# Patient Record
Sex: Male | Born: 1960 | Race: White | Hispanic: No | Marital: Married | Smoking: Former smoker
Health system: Southern US, Community
[De-identification: ages and names within clinical notes are randomized; demographics above are authoritative.]

## PROBLEM LIST (undated history)

## (undated) DIAGNOSIS — I4891 Unspecified atrial fibrillation: Secondary | ICD-10-CM

## (undated) DIAGNOSIS — E785 Hyperlipidemia, unspecified: Secondary | ICD-10-CM

## (undated) DIAGNOSIS — R079 Chest pain, unspecified: Secondary | ICD-10-CM

## (undated) DIAGNOSIS — I214 Non-ST elevation (NSTEMI) myocardial infarction: Secondary | ICD-10-CM

## (undated) HISTORY — DX: Chest pain, unspecified: R07.9

## (undated) HISTORY — DX: Unspecified atrial fibrillation: I48.91

## (undated) HISTORY — DX: Non-ST elevation (NSTEMI) myocardial infarction: I21.4

## (undated) HISTORY — DX: Hyperlipidemia, unspecified: E78.5

---

## 2012-02-27 ENCOUNTER — Inpatient Hospital Stay (HOSPITAL_COMMUNITY)
Admission: EM | Admit: 2012-02-27 | Discharge: 2012-03-06 | DRG: 234 | Disposition: A | Discharge status: Home / Self Care | Payer: MEDICAID | Attending: Cardiothoracic Surgery | Admitting: Cardiothoracic Surgery

## 2012-02-27 DIAGNOSIS — D62 Acute posthemorrhagic anemia: Secondary | ICD-10-CM | POA: Diagnosis not present

## 2012-02-27 DIAGNOSIS — F411 Generalized anxiety disorder: Secondary | ICD-10-CM | POA: Diagnosis present

## 2012-02-27 DIAGNOSIS — Z8249 Family history of ischemic heart disease and other diseases of the circulatory system: Secondary | ICD-10-CM

## 2012-02-27 DIAGNOSIS — R079 Chest pain, unspecified: Secondary | ICD-10-CM

## 2012-02-27 DIAGNOSIS — E785 Hyperlipidemia, unspecified: Secondary | ICD-10-CM

## 2012-02-27 DIAGNOSIS — I214 Non-ST elevation (NSTEMI) myocardial infarction: Principal | ICD-10-CM

## 2012-02-27 DIAGNOSIS — I252 Old myocardial infarction: Secondary | ICD-10-CM

## 2012-02-27 DIAGNOSIS — D72829 Elevated white blood cell count, unspecified: Secondary | ICD-10-CM | POA: Diagnosis present

## 2012-02-27 DIAGNOSIS — E876 Hypokalemia: Secondary | ICD-10-CM | POA: Diagnosis not present

## 2012-02-27 DIAGNOSIS — Z87891 Personal history of nicotine dependence: Secondary | ICD-10-CM

## 2012-02-27 DIAGNOSIS — Z79899 Other long term (current) drug therapy: Secondary | ICD-10-CM

## 2012-02-27 DIAGNOSIS — I4891 Unspecified atrial fibrillation: Secondary | ICD-10-CM

## 2012-02-27 DIAGNOSIS — Z7982 Long term (current) use of aspirin: Secondary | ICD-10-CM

## 2012-02-27 DIAGNOSIS — I2510 Atherosclerotic heart disease of native coronary artery without angina pectoris: Secondary | ICD-10-CM

## 2012-02-27 LAB — CBC WITH DIFFERENTIAL/PLATELET
Basophils Relative: 1 % (ref 0–1)
HCT: 40.3 % (ref 39.0–52.0)
Hemoglobin: 14.2 g/dL (ref 13.0–17.0)
Lymphs Abs: 2.2 10*3/uL (ref 0.7–4.0)
MCHC: 35.2 g/dL (ref 30.0–36.0)
Monocytes Absolute: 0.6 10*3/uL (ref 0.1–1.0)
Monocytes Relative: 6 % (ref 3–12)
Neutro Abs: 6 10*3/uL (ref 1.7–7.7)
RBC: 4.44 MIL/uL (ref 4.22–5.81)

## 2012-02-27 LAB — BASIC METABOLIC PANEL
BUN: 15 mg/dL (ref 6–23)
CO2: 26 mEq/L (ref 19–32)
Chloride: 104 mEq/L (ref 96–112)
Creatinine, Ser: 0.83 mg/dL (ref 0.50–1.35)
GFR calc Af Amer: >90 mL/min (ref >90)
Glucose, Bld: 120 mg/dL — ABNORMAL HIGH (ref 70–99)
Potassium: 3.4 mEq/L — ABNORMAL LOW (ref 3.5–5.1)

## 2012-02-27 MED ORDER — HEPARIN (PORCINE) IN NACL 100-0.45 UNIT/ML-% IJ SOLN
1350.0000 [IU]/h | INTRAMUSCULAR | Status: DC
  Administered 2012-02-27: 1000 [IU]/h via INTRAVENOUS
  Administered 2012-02-28: 1350 [IU]/h via INTRAVENOUS
  Filled 2012-02-27 (×2): qty 250

## 2012-02-27 MED ORDER — POTASSIUM CHLORIDE CRYS ER 20 MEQ PO TBCR
40.0000 meq | EXTENDED_RELEASE_TABLET | Freq: Once | ORAL | Status: AC
  Administered 2012-02-28: 40 meq via ORAL
  Filled 2012-02-27: qty 2

## 2012-02-27 MED ORDER — HEPARIN BOLUS VIA INFUSION
4000.0000 [IU] | Freq: Once | INTRAVENOUS | Status: AC
  Administered 2012-02-27: 4000 [IU] via INTRAVENOUS

## 2012-02-27 MED ORDER — SODIUM CHLORIDE 0.9 % IV SOLN
INTRAVENOUS | Status: DC
  Administered 2012-02-27: 23:00:00 via INTRAVENOUS

## 2012-02-27 MED ORDER — NITROGLYCERIN 2 % TD OINT
1.0000 [in_us] | TOPICAL_OINTMENT | Freq: Four times a day (QID) | TRANSDERMAL | Status: DC
  Administered 2012-02-27: 1 [in_us] via TOPICAL
  Filled 2012-02-27: qty 1

## 2012-02-27 NOTE — ED Notes (Signed)
Pt  Reverted to normal sinus rhythm.

## 2012-02-27 NOTE — ED Provider Notes (Addendum)
History     CSN: [id number]  Arrival date & time 02/27/12  2120   First MD Initiated Contact with Patient 02/27/12 2233      Chief Complaint  Patient presents with  . Atrial Fibrillation    (Consider location/radiation/quality/duration/timing/severity/associated sxs/prior treatment) Patient is a 51 y.o. male presenting with atrial fibrillation. The history is provided by the patient.  Atrial Fibrillation Associated symptoms include chest pain. Pertinent negatives include no shortness of breath.   51 year old, male, with a history of hypercholesterolemia, and family history of coronary artery disease, presents emergency department complaining of left sided chest pain.  That he describes as a firmness associated with mild nausea, and soaking, diaphoresis.  He denied shortness of breath.  His symptoms began while he was watching television.  He states that he has had similar symptoms twice in the past.  Once occurred when he was riding his bicycle and another time when he was mowing the lawn when he stopped exercising.  The symptoms resolved.  Today.  His symptoms resolved after getting 2 nitroglycerin, which were given to him by the EMS crew.  He also took aspirin prior to arrival to the emergency department.  Presently, he is asymptomatic.  He denies cough, fevers, chills, leg pain or swelling.  He is in excellent condition and rides his bicycle almost daily.  He does not smoke cigarettes.  Besides having high cholesterol.  He has no other past medical history.  No past medical history on file.  No past surgical history on file.  No family history on file.  History  Substance Use Topics  . Smoking status: Not on file  . Smokeless tobacco: Not on file  . Alcohol Use: Not on file      Review of Systems  Constitutional: Positive for diaphoresis.  HENT: Negative for neck pain.   Respiratory: Negative for cough and shortness of breath.   Cardiovascular: Positive for chest pain.  Negative for palpitations and leg swelling.       Chest pain, which he describes as a hardness, which radiated to his left arm  Gastrointestinal: Positive for nausea. Negative for vomiting.  All other systems reviewed and are negative.    Allergies  Review of patient's allergies indicates no known allergies.  Home Medications   Current Outpatient Rx  Name Route Sig Dispense Refill  . ASPIRIN EC 81 MG PO TBEC Oral Take 81 mg by mouth daily.    . ATORVASTATIN CALCIUM 80 MG PO TABS Oral Take 80 mg by mouth daily.    . DESOXIMETASONE 0.05 % EX CREA Topical Apply 1 application topically 2 (two) times daily.      BP 127/95  Pulse 70  Temp 98.4 F (36.9 C) (Oral)  Resp 20  SpO2 97%  Physical Exam  Nursing note and vitals reviewed. Constitutional: He is oriented to person, place, and time. He appears well-developed and well-nourished. No distress.  HENT:  Head: Normocephalic and atraumatic.  Eyes: Conjunctivae are normal.  Neck: Normal range of motion. Neck supple.  Cardiovascular: Normal rate.   No murmur heard. Pulmonary/Chest: Effort normal and breath sounds normal. No respiratory distress.  Abdominal: Soft. He exhibits no distension.  Musculoskeletal: Normal range of motion.  Neurological: He is alert and oriented to person, place, and time.  Skin: Skin is warm and dry.  Psychiatric: He has a normal mood and affect. Thought content normal.    ED Course  Procedures (including critical care time) chest pain, with radiation into the  left arm, associated with nausea, and diaphoresis.  In a male, who is in excellent physical condition, but has a history of elevated cholesterol, positive family history of coronary artery disease.  His symptoms are resolved.  Now.  We will perform laboratory testing, EKG, and chest x-ray, for evaluation.  Labs Reviewed  CBC WITH DIFFERENTIAL - Abnormal; Notable for the following:    Eosinophils Relative 7 (*)     All other components within  normal limits  BASIC METABOLIC PANEL - Abnormal; Notable for the following:    Potassium 3.4 (*)     Glucose, Bld 120 (*)     All other components within normal limits  POCT I-STAT TROPONIN I - Abnormal; Notable for the following:    Troponin i, poc 0.43 (*)     All other components within normal limits   No results found.   No diagnosis found.  ECG. Atrial fibrillation at 131 beats per minute. Normal axis. Normal intervals. Impression atrial fibrillation, with rapid ventricular response  MDM  Non-STEMI pain-free in the ER.  Nitro paste, and IV heparin started  Discussed with the cardiologist.  He will come see pt.    Discussed with dr. Thedore Mins.  He asked me to consult cards before he would accept the pt.        Cheri Guppy, MD 02/28/12 0018  Cheri Guppy, MD 02/28/12 0020  Cheri Guppy, MD 03/29/12 [phone]

## 2012-02-27 NOTE — ED Notes (Signed)
Pt brought to ED by EMS with chest pain and A fib.Pt took aspirin X 8 and Nitro spray X2.

## 2012-02-28 ENCOUNTER — Encounter (HOSPITAL_COMMUNITY): Payer: Self-pay | Admitting: *Deleted

## 2012-02-28 ENCOUNTER — Encounter (HOSPITAL_COMMUNITY)
Admission: EM | Disposition: A | Discharge status: Home / Self Care | Payer: Self-pay | Source: Home / Self Care | Attending: Cardiothoracic Surgery

## 2012-02-28 ENCOUNTER — Other Ambulatory Visit: Payer: Self-pay

## 2012-02-28 DIAGNOSIS — I4891 Unspecified atrial fibrillation: Secondary | ICD-10-CM | POA: Diagnosis present

## 2012-02-28 DIAGNOSIS — I2510 Atherosclerotic heart disease of native coronary artery without angina pectoris: Secondary | ICD-10-CM

## 2012-02-28 DIAGNOSIS — I214 Non-ST elevation (NSTEMI) myocardial infarction: Secondary | ICD-10-CM | POA: Diagnosis present

## 2012-02-28 DIAGNOSIS — R079 Chest pain, unspecified: Secondary | ICD-10-CM | POA: Diagnosis present

## 2012-02-28 DIAGNOSIS — E785 Hyperlipidemia, unspecified: Secondary | ICD-10-CM | POA: Diagnosis present

## 2012-02-28 HISTORY — PX: LEFT HEART CATHETERIZATION WITH CORONARY ANGIOGRAM: SHX5451

## 2012-02-28 LAB — CBC
HCT: 36.3 % — ABNORMAL LOW (ref 39.0–52.0)
Hemoglobin: 12.6 g/dL — ABNORMAL LOW (ref 13.0–17.0)
MCH: 31.2 pg (ref 26.0–34.0)
MCHC: 34 g/dL (ref 30.0–36.0)
MCHC: 34.7 g/dL (ref 30.0–36.0)
MCV: 91.8 fL (ref 78.0–100.0)
MCV: 90.8 fL (ref 78.0–100.0)
Platelets: 229 10*3/uL (ref 150–400)
RDW: 13.4 % (ref 11.5–15.5)
RDW: 13.1 % (ref 11.5–15.5)
WBC: 10.8 10*3/uL — ABNORMAL HIGH (ref 4.0–10.5)

## 2012-02-28 LAB — CARDIAC PANEL(CRET KIN+CKTOT+MB+TROPI)
CK, MB: 8.4 ng/mL (ref 0.3–4.0)
CK, MB: 10 ng/mL (ref 0.3–4.0)
Total CK: 144 U/L (ref 7–232)
Troponin I: 1.7 ng/mL (ref <0.30)

## 2012-02-28 LAB — MRSA PCR SCREENING: MRSA by PCR: NEGATIVE

## 2012-02-28 LAB — TROPONIN I: Troponin I: 2.53 ng/mL (ref <0.30)

## 2012-02-28 LAB — BASIC METABOLIC PANEL
BUN: 13 mg/dL (ref 6–23)
CO2: 22 mEq/L (ref 19–32)
Calcium: 8.8 mg/dL (ref 8.4–10.5)
Chloride: 106 mEq/L (ref 96–112)
Creatinine, Ser: 0.71 mg/dL (ref 0.50–1.35)
Glucose, Bld: 107 mg/dL — ABNORMAL HIGH (ref 70–99)
Potassium: 3.8 mEq/L (ref 3.5–5.1)
Sodium: 138 mEq/L (ref 135–145)

## 2012-02-28 LAB — LIPID PANEL
LDL Cholesterol: 177 mg/dL — ABNORMAL HIGH (ref 0–99)
VLDL: 39 mg/dL (ref 0–40)

## 2012-02-28 SURGERY — LEFT HEART CATHETERIZATION WITH CORONARY ANGIOGRAM
Anesthesia: LOCAL

## 2012-02-28 MED ORDER — NITROGLYCERIN IN D5W 200-5 MCG/ML-% IV SOLN
INTRAVENOUS | Status: AC
  Filled 2012-02-28: qty 250

## 2012-02-28 MED ORDER — ONDANSETRON HCL 4 MG PO TABS: 4.0000 mg | ORAL_TABLET | Freq: Four times a day (QID) | ORAL | Status: DC | PRN

## 2012-02-28 MED ORDER — NITROGLYCERIN 2 % TD OINT
1.0000 [in_us] | TOPICAL_OINTMENT | Freq: Four times a day (QID) | TRANSDERMAL | Status: DC
  Administered 2012-02-28: 1 [in_us] via TOPICAL
  Filled 2012-02-28 (×2): qty 30

## 2012-02-28 MED ORDER — ATORVASTATIN CALCIUM 80 MG PO TABS
80.0000 mg | ORAL_TABLET | Freq: Once | ORAL | Status: AC
  Administered 2012-02-28: 80 mg via ORAL
  Filled 2012-02-28: qty 1

## 2012-02-28 MED ORDER — MORPHINE SULFATE 2 MG/ML IJ SOLN
2.0000 mg | INTRAMUSCULAR | Status: DC | PRN
  Administered 2012-02-28 – 2012-03-01 (×2): 2 mg via INTRAVENOUS
  Filled 2012-02-28 (×2): qty 1

## 2012-02-28 MED ORDER — NITROGLYCERIN 0.2 MG/ML ON CALL CATH LAB
INTRAVENOUS | Status: AC
  Filled 2012-02-28: qty 1

## 2012-02-28 MED ORDER — SODIUM CHLORIDE 0.9 % IV SOLN: 1.0000 mL/kg/h | INTRAVENOUS | Status: AC

## 2012-02-28 MED ORDER — DIAZEPAM 5 MG PO TABS
5.0000 mg | ORAL_TABLET | ORAL | Status: AC
  Administered 2012-02-28: 5 mg via ORAL
  Filled 2012-02-28: qty 1

## 2012-02-28 MED ORDER — ONDANSETRON HCL 4 MG/2ML IJ SOLN: 4.0000 mg | Freq: Four times a day (QID) | INTRAMUSCULAR | Status: DC | PRN

## 2012-02-28 MED ORDER — GUAIFENESIN-DM 100-10 MG/5ML PO SYRP: 5.0000 mL | ORAL_SOLUTION | ORAL | Status: DC | PRN

## 2012-02-28 MED ORDER — ACETAMINOPHEN 325 MG PO TABS
650.0000 mg | ORAL_TABLET | ORAL | Status: DC | PRN
  Administered 2012-02-28: 650 mg via ORAL
  Filled 2012-02-28 (×2): qty 2

## 2012-02-28 MED ORDER — MIDAZOLAM HCL 2 MG/2ML IJ SOLN
INTRAMUSCULAR | Status: AC
  Filled 2012-02-28: qty 2

## 2012-02-28 MED ORDER — SODIUM CHLORIDE 0.9 % IJ SOLN: 3.0000 mL | INTRAMUSCULAR | Status: DC | PRN

## 2012-02-28 MED ORDER — SODIUM CHLORIDE 0.9 % IV SOLN: 1.0000 mL/kg/h | INTRAVENOUS | Status: DC

## 2012-02-28 MED ORDER — ONDANSETRON HCL 4 MG/2ML IJ SOLN
4.0000 mg | Freq: Four times a day (QID) | INTRAMUSCULAR | Status: DC | PRN
  Administered 2012-02-28 – 2012-03-01 (×2): 4 mg via INTRAVENOUS
  Filled 2012-02-28 (×3): qty 2

## 2012-02-28 MED ORDER — ATORVASTATIN CALCIUM 80 MG PO TABS
80.0000 mg | ORAL_TABLET | Freq: Every day | ORAL | Status: DC
  Administered 2012-02-29 – 2012-03-05 (×5): 80 mg via ORAL
  Filled 2012-02-28 (×7): qty 1

## 2012-02-28 MED ORDER — ATORVASTATIN CALCIUM 10 MG PO TABS: 10.0000 mg | ORAL_TABLET | Freq: Every day | ORAL | Status: DC

## 2012-02-28 MED ORDER — HYDROCODONE-ACETAMINOPHEN 5-325 MG PO TABS
1.0000 | ORAL_TABLET | ORAL | Status: DC | PRN
  Administered 2012-02-28: 1 via ORAL
  Administered 2012-02-29 – 2012-03-01 (×3): 2 via ORAL
  Filled 2012-02-28 (×2): qty 2
  Filled 2012-02-28: qty 1
  Filled 2012-02-28: qty 2

## 2012-02-28 MED ORDER — SODIUM CHLORIDE 0.9 % IV SOLN
INTRAVENOUS | Status: DC
  Administered 2012-02-28: 03:00:00 via INTRAVENOUS

## 2012-02-28 MED ORDER — MORPHINE SULFATE 2 MG/ML IJ SOLN: 2.0000 mg | INTRAMUSCULAR | Status: DC | PRN

## 2012-02-28 MED ORDER — ASPIRIN EC 81 MG PO TBEC: 81.0000 mg | DELAYED_RELEASE_TABLET | Freq: Every day | ORAL | Status: DC

## 2012-02-28 MED ORDER — SODIUM CHLORIDE 0.9 % IJ SOLN
3.0000 mL | Freq: Two times a day (BID) | INTRAMUSCULAR | Status: DC
  Administered 2012-02-29: 3 mL via INTRAVENOUS

## 2012-02-28 MED ORDER — LIDOCAINE HCL (PF) 1 % IJ SOLN
INTRAMUSCULAR | Status: AC
  Filled 2012-02-28: qty 30

## 2012-02-28 MED ORDER — SODIUM CHLORIDE 0.9 % IV SOLN: 250.0000 mL | INTRAVENOUS | Status: DC

## 2012-02-28 MED ORDER — VERAPAMIL HCL 2.5 MG/ML IV SOLN
INTRAVENOUS | Status: AC
  Filled 2012-02-28: qty 2

## 2012-02-28 MED ORDER — HEPARIN BOLUS VIA INFUSION
2000.0000 [IU] | Freq: Once | INTRAVENOUS | Status: AC
  Administered 2012-02-28: 2000 [IU] via INTRAVENOUS
  Filled 2012-02-28: qty 2000

## 2012-02-28 MED ORDER — ALBUTEROL SULFATE (5 MG/ML) 0.5% IN NEBU: 2.5000 mg | INHALATION_SOLUTION | RESPIRATORY_TRACT | Status: DC | PRN

## 2012-02-28 MED ORDER — HEPARIN (PORCINE) IN NACL 100-0.45 UNIT/ML-% IJ SOLN
1650.0000 [IU]/h | INTRAMUSCULAR | Status: DC
  Administered 2012-02-28: 1350 [IU]/h via INTRAVENOUS
  Filled 2012-02-28 (×2): qty 250

## 2012-02-28 MED ORDER — SODIUM CHLORIDE 0.9 % IJ SOLN: 3.0000 mL | Freq: Two times a day (BID) | INTRAMUSCULAR | Status: DC

## 2012-02-28 MED ORDER — HEPARIN (PORCINE) IN NACL 2-0.9 UNIT/ML-% IJ SOLN
INTRAMUSCULAR | Status: AC
  Filled 2012-02-28: qty 2000

## 2012-02-28 MED ORDER — ATORVASTATIN CALCIUM 80 MG PO TABS: 80.0000 mg | ORAL_TABLET | Freq: Every day | ORAL | Status: DC

## 2012-02-28 MED ORDER — ASPIRIN 325 MG PO TABS
325.0000 mg | ORAL_TABLET | Freq: Every day | ORAL | Status: DC
  Administered 2012-02-28 – 2012-02-29 (×2): 325 mg via ORAL
  Filled 2012-02-28 (×3): qty 1

## 2012-02-28 MED ORDER — NITROGLYCERIN IN D5W 200-5 MCG/ML-% IV SOLN
5.0000 ug/min | INTRAVENOUS | Status: DC
  Administered 2012-03-01: 10 ug/min via INTRAVENOUS
  Filled 2012-02-28 (×2): qty 250

## 2012-02-28 MED ORDER — EPTIFIBATIDE 75 MG/100ML IV SOLN
2.0000 ug/kg/min | INTRAVENOUS | Status: DC
  Administered 2012-02-29 (×4): 2 ug/kg/min via INTRAVENOUS
  Filled 2012-02-28 (×10): qty 100

## 2012-02-28 MED ORDER — METOPROLOL TARTRATE 12.5 MG HALF TABLET
12.5000 mg | ORAL_TABLET | Freq: Two times a day (BID) | ORAL | Status: DC
  Administered 2012-02-29: 12.5 mg via ORAL
  Filled 2012-02-28 (×7): qty 1

## 2012-02-28 MED ORDER — SODIUM CHLORIDE 0.9 % IV SOLN: 250.0000 mL | INTRAVENOUS | Status: DC | PRN

## 2012-02-28 MED ORDER — FENTANYL CITRATE 0.05 MG/ML IJ SOLN
INTRAMUSCULAR | Status: AC
  Filled 2012-02-28: qty 2

## 2012-02-28 MED ORDER — EPTIFIBATIDE 75 MG/100ML IV SOLN
INTRAVENOUS | Status: AC
  Filled 2012-02-28: qty 100

## 2012-02-28 NOTE — Progress Notes (Addendum)
[facility]  Subjective: No CP or SOB.   Objective: Vital signs in last 24 hours: Temp:  [98.1 F (36.7 C)-98.4 F (36.9 C)] 98.4 F (36.9 C) (08/12 0800) Pulse Rate:  [48-135] 48  (08/12 0800) Resp:  [13-22] 16  (08/12 0800) BP: (112-142)/(71-95) 124/81 mmHg (08/12 0800) SpO2:  [96 %-99 %] 97 % (08/12 0800) Weight:  [85.1 kg (187 lb 9.8 oz)] 85.1 kg (187 lb 9.8 oz) (08/12 0224) Last BM Date: 02/27/12  Intake/Output from previous day: 08/11 0701 - 08/12 0700 In: 371.4 [I.V.:371.4] Out: 850 [Urine:850] Intake/Output this shift: Total I/O In: 75 [I.V.:75] Out: 800 [Urine:800]  Medications Current Facility-Administered Medications  Medication Dose Route Frequency Provider Last Rate Last Dose  . 0.9 %  sodium chloride infusion   Intravenous Continuous Leroy Sea, MD 75 mL/hr at 02/28/12 0800    . albuterol (PROVENTIL) (5 MG/ML) 0.5% nebulizer solution 2.5 mg  2.5 mg Nebulization Q2H PRN Leroy Sea, MD      . aspirin tablet 325 mg  325 mg Oral Daily Marykay Lex, MD   325 mg at 02/28/12 2841  . atorvastatin (LIPITOR) tablet 80 mg  80 mg Oral Once Leroy Sea, MD   80 mg at 02/28/12 0302  . atorvastatin (LIPITOR) tablet 80 mg  80 mg Oral q1800 Leroy Sea, MD      . guaiFENesin-dextromethorphan (ROBITUSSIN DM) 100-10 MG/5ML syrup 5 mL  5 mL Oral Q4H PRN Leroy Sea, MD      . heparin ADULT infusion 100 units/mL (25000 units/250 mL)  1,350 Units/hr Intravenous Continuous Lonia Blood, MD 13.5 mL/hr at 02/28/12 0800 1,350 Units/hr at 02/28/12 0800  . heparin bolus via infusion 2,000 Units  2,000 Units Intravenous Once Lonia Blood, MD   2,000 Units at 02/28/12 0645  . heparin bolus via infusion 4,000 Units  4,000 Units Intravenous Once Cheri Guppy, MD   4,000 Units at 02/27/12 2340  . HYDROcodone-acetaminophen (NORCO/VICODIN) 5-325 MG per tablet 1-2 tablet  1-2 tablet Oral Q4H PRN Leroy Sea, MD      .  metoprolol tartrate (LOPRESSOR) tablet 12.5 mg  12.5 mg Oral BID Leroy Sea, MD      . morphine 2 MG/ML injection 2 mg  2 mg Intravenous Q3H PRN Leroy Sea, MD      . nitroGLYCERIN (NITROGLYN) 2 % ointment 1 inch  1 inch Topical Q6H Leroy Sea, MD   1 inch at 02/28/12 3244  . ondansetron (ZOFRAN) tablet 4 mg  4 mg Oral Q6H PRN Leroy Sea, MD       Or  . ondansetron (ZOFRAN) injection 4 mg  4 mg Intravenous Q6H PRN Leroy Sea, MD      . potassium chloride SA (K-DUR,KLOR-CON) CR tablet 40 mEq  40 mEq Oral Once Leroy Sea, MD   40 mEq at 02/28/12 0106  . sodium chloride 0.9 % injection 3 mL  3 mL Intravenous Q12H Leroy Sea, MD      . DISCONTD: 0.9 %  sodium chloride infusion   Intravenous Continuous Cheri Guppy, MD 125 mL/hr at 02/27/12 2300    . DISCONTD: aspirin EC tablet 81 mg  81 mg Oral Daily Leroy Sea, MD      . DISCONTD: atorvastatin (LIPITOR) tablet 10 mg  10 mg Oral q1800 Leroy Sea, MD      . DISCONTD: atorvastatin (LIPITOR) tablet 10 mg  10 mg Oral q1800 Leroy Sea, MD      . DISCONTD: atorvastatin (LIPITOR) tablet 80 mg  80 mg Oral q1800 Leroy Sea, MD      . DISCONTD: nitroGLYCERIN (NITROGLYN) 2 % ointment 1 inch  1 inch Topical Q6H Cheri Guppy, MD   1 inch at 02/27/12 2303    PE: General appearance: alert, cooperative and no distress Lungs: clear to auscultation bilaterally Heart: regular rate and rhythm, S1, S2 normal, no murmur, click, rub or gallop Extremities: No LEE Pulses: 2+ and symmetric Skin: Warm and Dry Neurologic: Grossly normal  Lab Results:   Basename 02/28/12 0249 02/27/12 2156  WBC 9.8 9.6  HGB 12.5* 14.2  HCT 36.8* 40.3  PLT 229 236   BMET  Basename 02/28/12 0249 02/27/12 2156  NA 138 141  K 3.8 3.4*  CL 106 104  CO2 22 26  GLUCOSE 107* 120*  BUN 13 15  CREATININE 0.71 0.83  CALCIUM 8.8 9.5   PT/INR  Basename 02/28/12 0249  LABPROT 14.9  INR 1.15   Lipid Panel      Component Value Date/Time   CHOL 259* 02/28/2012 0500   TRIG 194* 02/28/2012 0500   HDL 43 02/28/2012 0500   CHOLHDL 6.0 02/28/2012 0500   VLDL 39 02/28/2012 0500   LDLCALC 177* 02/28/2012 0500    Cardiac Panel (last 3 results)  Basename 02/28/12 0249 02/28/12 0046  CKTOTAL 125 --  CKMB 8.4* --  TROPONINI 1.70* 2.53*  RELINDX 6.7* --    Assessment/Plan  Principal Problem:  *NSTEMI (non-ST elevated myocardial infarction) Active Problems:  Dyslipidemia  Atrial fibrillation with rapid ventricular response - converted spontaneously with resoluition of Chest pain after NTG  Plan:  Doing well.  No more AFIB.  Left heart cath today.  He was prescribed a statin but never took it.   LOS: 1 day    HAGER, BRYAN 02/28/2012 9:28 AM  I have seen & examined the patient this AM.  He remains stable from earlier this AM.  No further CP or Afib - Troponin level peaked at 2.68 so far. (strange up-down-up trend).  A1c & TSH normal.  Restart statin.  Careful use of BB with baseline bradycardia.  Plan Cath with possible PCI today.  May need to consider BMS if appropriate due to his potential need for Root Canal in the upcoming future.  Marykay Lex, M.D., M.S. [facility] [address] [place], [place]  [address]  [phone] Pager # [phone]  02/28/2012 11:45 AM

## 2012-02-28 NOTE — Progress Notes (Addendum)
ANTICOAGULATION CONSULT NOTE   Pharmacy Consult for heparin Indication: chest pain / ACS  No Known Allergies  Patient Measurements: Height: 5' 11.5" (181.6 cm) Weight: 187 lb 9.8 oz (85.1 kg) IBW/kg (Calculated) : 76.45  Heparin Dosing Weight: 85 kg   Vital Signs: Temp: 98.2 F (36.8 C) (08/12 1125) Temp src: Oral (08/12 1125) BP: 126/94 mmHg (08/12 1125) Pulse Rate: 53  (08/12 1125)  Labs:  Basename 02/28/12 1001 02/28/12 0500 02/28/12 0249 02/28/12 0046 02/27/12 2156  HGB -- -- 12.5* -- 14.2  HCT -- -- 36.8* -- 40.3  PLT -- -- 229 -- 236  APTT -- -- -- -- --  LABPROT -- -- 14.9 -- --  INR -- -- 1.15 -- --  HEPARINUNFRC -- <0.10* -- -- --  CREATININE -- -- 0.71 -- 0.83  CKTOTAL 144 -- 125 -- --  CKMB 10.0* -- 8.4* -- --  TROPONINI 2.68* -- 1.70* 2.53* --    Estimated Creatinine Clearance: 119.5 ml/min (by C-G formula based on Cr of 0.71).  Assessment: 51 yo male with NSTEMI s/p cath for CABG evaluation to restart heparin 6 hours post sheath removal. Patient also to be on integrelin until CABG can be done  Goal of Therapy:  Heparin level= 0.3-0.5 Monitor platelets by anticoagulation protocol: Yes   Plan:  -Re-start heparin 6 hours post TR band removal at 1350 units/hr  -Continue integrelin at 2 mcg/kg/min -Heparin level and CBC in 8hrs -Heparin level and CBC daily  Harland German, Pharm D 02/28/2012 2:20 PM

## 2012-02-28 NOTE — Progress Notes (Signed)
ANTICOAGULATION CONSULT NOTE   Pharmacy Consult for heparin Indication: chest pain / ACS  No Known Allergies  Patient Measurements: Height: 5' 11.5" (181.6 cm) Weight: 187 lb 9.8 oz (85.1 kg) IBW/kg (Calculated) : 76.45  Heparin Dosing Weight: 85 kg   Vital Signs: Temp: 98.2 F (36.8 C) (08/12 0224) Temp src: Oral (08/12 0224) BP: 132/90 mmHg (08/12 0230) Pulse Rate: 54  (08/12 0230)  Labs:  Basename 02/28/12 0500 02/28/12 0249 02/28/12 0046 02/27/12 2156  HGB -- 12.5* -- 14.2  HCT -- 36.8* -- 40.3  PLT -- 229 -- 236  APTT -- -- -- --  LABPROT -- 14.9 -- --  INR -- 1.15 -- --  HEPARINUNFRC <0.10* -- -- --  CREATININE -- 0.71 -- 0.83  CKTOTAL -- 125 -- --  CKMB -- 8.4* -- --  TROPONINI -- 1.70* 2.53* --    Estimated Creatinine Clearance: 119.5 ml/min (by C-G formula based on Cr of 0.71).  Assessment: 51 yo male with NSTEMI for Heparin Goal of Therapy:  Heparin level 0.3-0.7 units/ml Monitor platelets by anticoagulation protocol: Yes   Plan:  Heparin 2000 units IV bolus, then increase heparin 1350 units/hr F/U after cath  Eddie Candle 02/28/2012,6:35 AM

## 2012-02-28 NOTE — Consult Note (Signed)
[facility]  CARDIOLOGY CONSULTATION NOTE.  NAME:  Mark Moyer   MRN: [id number] DOB:  Jul 11, 1961   ADMIT DATE: 02/27/2012  Reason for Consult: Launa Grill  Requesting Physician: Susa Raring  Primary Cardiologist: None  HPI: This is a 51 y.o. male with a past medical history significant for dyslipidemia with a significant FH of CAD (father Mi @ age 75) who used to bike regularly until an episode of chest discomfort a few months ago "scared him" - every time after that, he did not get too far without having some discomfort.  He has had at least one more episode of exertional chest pain while mowing the lawn since then.  Otherwise, he has just noted decreased exercise tolerance.  He was doing fine until 02/27/12 PM when he had gradual onset of left-sided chest discomfort described as a heavy firmness with a sharp radiation to the Left arm.  He denies SOB, but did note mild nausea and broke out in a sweat.  The episode lasted ~1/2 hr, so he called EMS.  Upon EMS arrival, he was given ASA & 2 sequential doses of NTG SL with relief of his Sx.  ECG on arrival to the (Personally reviewed with the ER MD) showed Afib with RVR of 131bpm with ? LVH.  Shortly after, once CP free, a repeat ECG (not yet in chart, but reviewed with ER MD) revealed NSR without notable ischemic changes.  Troponin #1 0.43, #2 ~2.5. In ED - IV Heparin 4000 Units, with gtt initiated.  pertinent negatives - claudication, dyspnea, lower extremity edema, near-syncope, orthopnea, palpitations, paroxysmal nocturnal dyspnea, syncope and tachypnea Cardiac Risk Factors: dyslipidemia and family history of premature cardiovascular disease  PMHx:  CARDIAC HISTORY: No prior history  FAMHx: Father has CAD, diagnosed with MI in 36s with CABG x 4, occurred within a year of TIA/Amaurosis Fugax Sx.  SOCHx:  does not have a smoking history on file. He does not have any smokeless tobacco history on file. He drinks  1-2 glasses of wine per week.  ALLERGIES: No Known Allergies  ROS: chest pressure/discomfort, exertional chest pressure/discomfort, fatigue and irregular heart beat A comprehensive review of systems was negative except for: Gastrointestinal: positive for 1 episode of loose stool yesterday Behavioral/Psych: positive for anxiety he has had recent dental work with crown placement & may require a Root Canal if the infection does not clear up  HOME MEDICATIONS: ASA 81mg  po daily; Atorvastatin 80 mg po daily (has not been taking); beclomethasone ointment 0.5% apply bid  HOSPITAL MEDICATIONS: Heparin gtt (after 4000 Unit Bolus), ASA, Atorvastatin 80mg , Metoprolol 12.5 mg bid, PRN Morphinr, Zofran, Robitussin, Albuterol.  VITALS: Blood pressure 125/71, pulse 51, temperature 98.4 F (36.9 C), temperature source Oral, resp. rate 18, SpO2 99.00%. PHYSICAL EXAM: General appearance: alert, cooperative, appears stated age, no distress and pleasant mood & affect.  answers ?s appropriately Neck: no adenopathy, no carotid bruit, no JVD, supple, symmetrical, trachea midline and thyroid not enlarged, symmetric, no tenderness/mass/nodules Lungs: clear to auscultation bilaterally, normal percussion bilaterally and non-labored Heart: regular rate and rhythm, S1, S2 normal, no murmur, click, rub or gallop and mildly bradycardic in 50s Abdomen: soft, non-tender; bowel sounds normal; no masses,  no organomegaly Extremities: extremities normal, atraumatic, no cyanosis or edema Pulses: 2+ and symmetric Skin: Skin color, texture, turgor normal. No rashes or lesions Neurologic: Alert and oriented X 3, normal strength and tone. Normal symmetric reflexes. Normal coordination and gait Mental status: Alert, oriented, thought content  appropriate Cranial nerves: normal Wears glasses. mallampati Airway - 2  LABS: Results for orders placed during the hospital encounter of 02/27/12 (from the past 24 hour(s))  CBC WITH  DIFFERENTIAL     Status: Abnormal   Collection Time   02/27/12  9:56 PM      Component Value Range   WBC 9.6  4.0 - 10.5 K/uL   RBC 4.44  4.22 - 5.81 MIL/uL   Hemoglobin 14.2  13.0 - 17.0 g/dL   HCT 16.1  09.6 - 04.5 %   MCV 90.8  78.0 - 100.0 fL   MCH 32.0  26.0 - 34.0 pg   MCHC 35.2  30.0 - 36.0 g/dL   RDW 40.9  81.1 - 91.4 %   Platelets 236  150 - 400 K/uL   Neutrophils Relative 62  43 - 77 %   Neutro Abs 6.0  1.7 - 7.7 K/uL   Lymphocytes Relative 23  12 - 46 %   Lymphs Abs 2.2  0.7 - 4.0 K/uL   Monocytes Relative 6  3 - 12 %   Monocytes Absolute 0.6  0.1 - 1.0 K/uL   Eosinophils Relative 7 (*) 0 - 5 %   Eosinophils Absolute 0.7  0.0 - 0.7 K/uL   Basophils Relative 1  0 - 1 %   Basophils Absolute 0.1  0.0 - 0.1 K/uL  BASIC METABOLIC PANEL     Status: Abnormal   Collection Time   02/27/12  9:56 PM      Component Value Range   Sodium 141  135 - 145 mEq/L   Potassium 3.4 (*) 3.5 - 5.1 mEq/L   Chloride 104  96 - 112 mEq/L   CO2 26  19 - 32 mEq/L   Glucose, Bld 120 (*) 70 - 99 mg/dL   BUN 15  6 - 23 mg/dL   Creatinine, Ser 7.82  0.50 - 1.35 mg/dL   Calcium 9.5  8.4 - 95.6 mg/dL   GFR calc non Af Amer >90  >90 mL/min   GFR calc Af Amer >90  >90 mL/min  POCT I-STAT TROPONIN I     Status: Abnormal   Collection Time   02/27/12 10:05 PM      Component Value Range   Troponin i, poc 0.43 (*) 0.00 - 0.08 ng/mL   Comment 3           TROPONIN I     Status: Abnormal   Collection Time   02/28/12 12:46 AM      Component Value Range   Troponin I 2.53 (*) <0.30 ng/mL   IMAGING: No CXR on file.  ECG #1 Afib RVR ~131; #2 NSR rate ~60s with no gross abnormalities.  IMPRESSION: Principal Problem:  *NSTEMI (non-ST elevated myocardial infarction) Active Problems:  Atrial fibrillation with rapid ventricular response - converted spontaneously with resoluition of Chest pain after NTG  Dyslipidemia  RECOMMENDATION: [facility] will take over care of the patient in the AM with plan to proceed  with cardiac catheterization +/- PCI given Classic ACS presentation with + Troponin c/w NSTEMI and significant FH.     Initiate standard medical therapy with: beta blocker, HMG-CoA reductase inhibitor, sublingual nitro which is used PRN and IV Heparin.  +/- ACE-I pending BP.  He is currently pain free with NTG paste -- anticipate stopping post cath.  Will gradually titrate up BB dose.  Hold off on thienopyridine antiplatelet agent until his anatomy has been delineated.   Cardiac cath to  rule out ischemic CAD. Possible angioplasty. The procedure and risks described to patient including risk of CVA, MI, bleeding, emergency surgery, death, vascular injury, VF/VT, renal failure (contrast induced nephropathy).. 2D echocardiogram ordered for full anatomic evaluation   PPI for GI prophyaxis  Afib with RVR may well be secondary to myocardial ischemia -- will need to monitor.  BB for rate control.  Consider d/c on 30 day MCOT / Event monitor.   Check lipid panel, TSH, Hgb A1c, and cycle cardiac markers.  DISPO: Provided no complications, anticipate d/c ~8/14.  Time Spent Directly with Patient: 25 minutes  HARDING,DAVID W, M.D., M.S. [facility] [address] [place], [place]  [address]  [phone]  02/28/2012 2:12 AM

## 2012-02-28 NOTE — Progress Notes (Signed)
ANTICOAGULATION CONSULT NOTE - Initial Consult  Pharmacy Consult for heparin Indication: chest pain / ACS  No Known Allergies  Patient Measurements: Height: 5' 11.5" (181.6 cm) Weight: 187 lb 9.8 oz (85.1 kg) IBW/kg (Calculated) : 76.45  Heparin Dosing Weight: 85 kg   Vital Signs: Temp: 98.2 F (36.8 C) (08/12 0224) Temp src: Oral (08/12 0224) BP: 132/90 mmHg (08/12 0230) Pulse Rate: 54  (08/12 0230)  Labs:  Basename 02/28/12 0046 02/27/12 2156  HGB -- 14.2  HCT -- 40.3  PLT -- 236  APTT -- --  LABPROT -- --  INR -- --  HEPARINUNFRC -- --  CREATININE -- 0.83  CKTOTAL -- --  CKMB -- --  TROPONINI 2.53* --    Estimated Creatinine Clearance: 115.2 ml/min (by C-G formula based on Cr of 0.83).   Medical History: History reviewed. No pertinent past medical history.  Medications:  Scheduled:    . aspirin  325 mg Oral Daily  . atorvastatin  80 mg Oral Once  . atorvastatin  80 mg Oral q1800  . heparin  4,000 Units Intravenous Once  . metoprolol tartrate  12.5 mg Oral BID  . nitroGLYCERIN  1 inch Topical Q6H  . potassium chloride  40 mEq Oral Once  . sodium chloride  3 mL Intravenous Q12H  . DISCONTD: aspirin EC  81 mg Oral Daily  . DISCONTD: atorvastatin  10 mg Oral q1800  . DISCONTD: atorvastatin  10 mg Oral q1800  . DISCONTD: atorvastatin  80 mg Oral q1800  . DISCONTD: nitroGLYCERIN  1 inch Topical Q6H    Assessment: 51 yo male admitted with NSTEMI. Heparin started in ED (4000 unit bolus x 1, then 1000 units/hr infusion). Pharmacy consulted to manage heparin.  Plan for cardiac cath.  Goal of Therapy:  Heparin level 0.3-0.7 units/ml Monitor platelets by anticoagulation protocol: Yes   Plan:  1. Continue IV heparin at 1000 units/hr. 2. Heparin level with AM labs today.  3. Daily CBC, heparin level.   Emeline Gins 02/28/2012,3:16 AM

## 2012-02-28 NOTE — Progress Notes (Signed)
Patient ID: Mark Moyer, male   DOB: [birth year], 51 y.o.   MRN: [id number]                    [address]            [place] [address]          [phone]       [facility] [place] [facility] Medical Record #[id number] Date of Birth: 1960-11-30  Referring: Dr Herbie Baltimore Primary Care: none  Chief Complaint:    Chief Complaint  Patient presents with  . nonstemi    History of Present Illness:     Mark Moyer is a 51 y.o. male, history of dyslipidemia who does not take medications due to lack of health insurance, who cycles several miles on a weekly basis, however for the last few months he has not cycled as he had an episode of chest discomfort with cycling few months ago. His  exercise capacity has declined since then,day of admission after dinner he was sitting on the couch when he had an episode of left-sided chest pressure and sharp pain radiating to his left arm making him sweat nauseated and short of breath, lasted for about half an hour, EMS arrived he received nitroglycerin and aspirin with chest pain relief, patient said that he had irregular heart beat in the ambulance, per EMS he was in atrial fibrillation but converted to sinus rhythm by itself. No Previous history o CAD. No DM    Current Activity/ Functional Status: Patient is  independent with mobility/ambulation, transfers, ADL's, IADL's.   History reviewed. No pertinent past medical history.  Lactose intolerant  History reviewed. No pertinent past surgical history. Vasectomy 20 years ago  History  Smoking status  . Former Smoker --  Quit 22years ago smoked for 15 years  Smokeless tobacco  . none    History  Alcohol Use   2-3 glasses wine per day  History   Social History  . Marital Status: Married    Spouse Name: Building services engineer    Number of Children: none  . Years of Education: N/A   Occupational History  . Web development   Social History Main Topics  . Smoking status: Former Smoker --  20 years  . Smokeless tobacco: Not on file  . Alcohol Use:   . Drug Use:   . Sexually Active:       No Known Allergies  Current Facility-Administered Medications  Medication Dose Route Frequency Provider Last Rate Last Dose  . 0.9 %  sodium chloride infusion  1 mL/kg/hr Intravenous Continuous Marykay Lex, MD 85.1 mL/hr at 02/28/12 1415 1 mL/kg/hr at 02/28/12 1415  . 0.9 %  sodium chloride infusion  250 mL Intravenous Continuous Marykay Lex, MD      . acetaminophen (TYLENOL) tablet 650 mg  650 mg Oral Q4H PRN Marykay Lex, MD      . albuterol (PROVENTIL) (5 MG/ML) 0.5% nebulizer solution 2.5 mg  2.5 mg Nebulization Q2H PRN Leroy Sea, MD      . aspirin tablet 325 mg  325 mg Oral Daily Marykay Lex, MD   325 mg at 02/28/12 2841  . atorvastatin (LIPITOR) tablet 80 mg  80 mg Oral Once Leroy Sea, MD   80 mg at 02/28/12 0302  . atorvastatin (LIPITOR) tablet 80 mg  80 mg Oral q1800 Leroy Sea, MD      . diazepam (VALIUM) tablet 5 mg  5 mg Oral On Call Marykay Lex, MD   5 mg at 02/28/12 1112  . eptifibatide (INTEGRILIN) 75 mg / 100 mL infusion           . eptifibatide (INTEGRILIN) 75 mg / 100 mL infusion  2 mcg/kg/min Intravenous Continuous Benny Lennert, PHARMD      . fentaNYL (SUBLIMAZE) 0.05 MG/ML injection           . guaiFENesin-dextromethorphan (ROBITUSSIN DM) 100-10 MG/5ML syrup 5 mL  5 mL Oral Q4H PRN Leroy Sea, MD      . heparin 2-0.9 UNIT/ML-% infusion           . heparin ADULT infusion 100 units/mL (25000 units/250 mL)  1,350 Units/hr Intravenous Continuous Benny Lennert, PHARMD      . heparin bolus via infusion 2,000 Units  2,000 Units Intravenous Once Lonia Blood, MD   2,000 Units at 02/28/12 0645  . heparin bolus via infusion 4,000 Units  4,000 Units Intravenous Once Cheri Guppy, MD   4,000 Units at 02/27/12 2340  . HYDROcodone-acetaminophen (NORCO/VICODIN) 5-325 MG per tablet 1-2 tablet  1-2 tablet Oral Q4H PRN  Leroy Sea, MD   1 tablet at 02/28/12 1022  . lidocaine (XYLOCAINE) 1 % injection           . metoprolol tartrate (LOPRESSOR) tablet 12.5 mg  12.5 mg Oral BID Leroy Sea, MD      . midazolam (VERSED) 2 MG/2ML injection           . midazolam (VERSED) 2 MG/2ML injection           . midazolam (VERSED) 2 MG/2ML injection           . morphine 2 MG/ML injection 2 mg  2 mg Intravenous Q3H PRN Leroy Sea, MD      . morphine 2 MG/ML injection 2 mg  2 mg Intravenous Q1H PRN Marykay Lex, MD   2 mg at 02/28/12 1657  . nitroGLYCERIN (NTG ON-CALL) 0.2 mg/mL injection           . nitroGLYCERIN 0.2 mg/mL in dextrose 5 % infusion           . nitroGLYCERIN 0.2 mg/mL in dextrose 5 % infusion  5-200 mcg/min Intravenous Titrated Marykay Lex, MD 12 mL/hr at 02/28/12 1800 40 mcg/min at 02/28/12 1800  . ondansetron (ZOFRAN) injection 4 mg  4 mg Intravenous Q6H PRN Marykay Lex, MD      . potassium chloride SA (K-DUR,KLOR-CON) CR tablet 40 mEq  40 mEq Oral Once Leroy Sea, MD   40 mEq at 02/28/12 0106  . sodium chloride 0.9 % injection 3 mL  3 mL Intravenous Q12H Marykay Lex, MD      . sodium chloride 0.9 % injection 3 mL  3 mL Intravenous PRN Marykay Lex, MD      . verapamil (ISOPTIN) 2.5 MG/ML injection             Prescriptions prior to admission  Medication Sig Dispense Refill  . aspirin EC 81 MG tablet Take 81 mg by mouth daily.      Marland Kitchen atorvastatin (LIPITOR) 80 MG tablet Take 80 mg by mouth daily.      Marland Kitchen desoximetasone (TOPICORT) 0.05 % cream Apply 1 application topically 2 (two) times daily.        History reviewed. No pertinent family history. Family history of elevated cholesterol sister as high as 500, Father:  had cabg age 40, and CEA , abnormal Lipids Grandfather sudden death 39  Review of Systems:     Cardiac Review of Systems: Y or N  Chest Pain [  y  ]  Resting SOB [  n ] Exertional SOB  [ y ]  Orthopnea [ n ]   Pedal Edema [ n  ]    Palpitations [a  fib by ems  ] Syncope  [n  ]   Presyncope [n   ]  General Review of Systems: [Y] = yes [  ]=no Constitional: recent weight change [n  ]; anorexia [  ]; fatigue [  ]; nausea [  ]; night sweats [  ]; fever [  ]; or chills [  ];                                                                                                                                          Dental: poor dentition[ y ]; crown put on tooth two weeks ago  Eye : blurred vision [  ]; diplopia [   ]; vision changes [ n ];  Amaurosis fugax[n  ]; Resp: cough [  ];  wheezing[ y;  hemoptysis[  ]; shortness of breath[y  ]; paroxysmal nocturnal dyspnea[y  ]; dyspnea on exertion[  y]; or orthopnea[  ];  GI:  gallstones[ n ], vomiting[  ];  dysphagia[  ]; melena[  ];  hematochezia [  ]; heartburn[  ];   Hx of  Colonoscopy[ n ]; GU: kidney stones [  ]; hematuria[  ];   dysuria [  n];  nocturia[ n ];  history of     obstruction [n  ];             Skin: rash, swelling[  ];, hair loss[  ];  peripheral edema[  ];  or itching[  ]; Musculosketetal: myalgias[  ];  joint swelling[  ];  joint erythema[  ];  joint pain[  ];  back pain[  ];  Heme/Lymph: bruising[  ];  bleeding[  ];  anemia[  ];  Neuro: TIA[  ];  headaches[  ];  stroke[  ];  vertigo[  ];  seizures[  ];   paresthesias[  ];  difficulty walking[n  ];  Psych:depression[  ]; anxiety[  ];  Endocrine: diabetes[  ];  thyroid dysfunction[  ];  Immunizations: Flu [ 2 years ]; Pneumococcal[n  ];  Other:  Physical Exam: BP 137/76  Pulse 49  Temp 98.6 F (37 C) (Oral)  Resp 11  Ht 5' 11.5" (1.816 m)  Wt 187 lb 9.8 oz (85.1 kg)  BMI 25.80 kg/m2  SpO2 99%  General appearance: alert, cooperative and appears stated age Neurologic: intact Heart: regular rate and rhythm, S1, S2 normal, no murmur, click, rub or gallop and normal apical impulse Lungs: clear to auscultation bilaterally and normal percussion bilaterally Abdomen:  soft, non-tender; bowel sounds normal; no masses,  no  organomegaly Extremities: extremities normal, atraumatic, no cyanosis or edema, Homans sign is negative, no sign of DVT and no edema, redness or tenderness in the calves or thighs Wound: cath site rt wrist no hematoma  Rt carotid bruit  Diagnostic Studies & Laboratory data:     Recent Radiology Findings:   No results found.   No chest xray has been done  Recent Lab Findings: Lab Results  Component Value Date   WBC 9.8 02/28/2012   HGB 12.5* 02/28/2012   HCT 36.8* 02/28/2012   PLT 229 02/28/2012   GLUCOSE 107* 02/28/2012   CHOL 259* 02/28/2012   TRIG 194* 02/28/2012   HDL 43 02/28/2012   LDLCALC 177* 02/28/2012   NA 138 02/28/2012   K 3.8 02/28/2012   CL 106 02/28/2012   CREATININE 0.71 02/28/2012   BUN 13 02/28/2012   CO2 22 02/28/2012   TSH 4.061 02/28/2012   INR 1.15 02/28/2012   HGBA1C 5.8* 02/28/2012   Lab Results  Component Value Date   CKTOTAL 144 02/28/2012   CKMB 10.0* 02/28/2012   TROPONINI 2.68* 02/28/2012   Cath: 02/28/2012 Hemodynamics:  Central Aortic / Mean Pressures: 124/75 mmHg; 96 mmHg  Left Ventricular Pressures / EDP: 120/13 mmHg; 12 mmHg Left Ventriculography:  EF: 50-55 %  Wall Motion: Anteroapical hypokinesis Coronary Anatomy:  Left Main: Large caliber vessel bifurcates into LAD and circumflex. Distal 20-30% stenosis. LAD: Large caliber vessel with proximal 50 and 40% lesions prior to the first diagonal branch. After the first diagonal there is a focal 80% lesion followed by diffuse 30-40% irregularities distally. The vessel then red rash around the apex. There is extensive small septal perforators which provide collaterals to appear to be posterior descending artery and the right.  D1: Small moderate caliber vessel with diffuse luminal irregularities. Left Circumflex: Moderate to large caliber vessel gives rise to very high for first obtuse marginal branch. At this bifurcation there is a 1, 1, 1 bifurcation lesion with roughly 60% stenoses in each segment pre-and  post in both vessels. The circumflex flex then continues on to give a second obtuse marginal branch and then into the AV groove where it gives off posterior lateral branches.  OM1: A moderate to large caliber vessel roughly same size as the circumflex with the ostial 60% of the described in a diffuse luminal irregularities at the courses as a Ramus distribution.  OM 2: This vessel is a moderate caliber vessel the AV groove it bifurcates into even size branches each of which have a roughly 60% lesion in each vessel. RCA: Large caliber dominant vessel with a near ostial 60% lesion to catheter damping. This is followed by a very focal irregular likely culprit lesion of 95%. Then vessel and normalizes for little bit and is diffusely diseased in the midportion. There is one normal size marginal branch. The vessel does appear bifurcating 2 with appears to be a very small Posterior Descending Artery that seems to be occluded in the midportion.  RPDA: Very small in caliber and appear to be occluded in the midportion. Fills via collaterals to the left and septal perforators.  RPL Sysytem:The Right Posterior AV Groove Branch small moderate caliber vessel diffusely diseased in the distal RCA and in this vessel. There is one major branch and several smaller branches. There major branch of the decent target for bypass.    Assessment / Plan:     I have recommended CABG with 3  vessel coronary disease symptomatic Will need aggressive lipid management with family history of cholestrols 300-500 Check carotid doppler with asymptomatic rt bruit Plan CABG Wednesday am    Delight Ovens MD  Beeper [phone] Office [phone] 02/28/2012 6:22 PM

## 2012-02-28 NOTE — Progress Notes (Signed)
  Echocardiogram 2D Echocardiogram has been performed.  Mark Moyer 02/28/2012, 11:28 AM

## 2012-02-28 NOTE — H&P (Addendum)
[facility]                                                                                    Patient Demographics  Mark Moyer, is a 51 y.o. male  CSN: [id number]  MRN: [id number]  DOB - [birth year]  Admit Date - 02/27/2012  Outpatient Primary MD for the patient is DEFAULT,PROVIDER, MD   With History of -  No past medical history on file.    No past surgical history on file.  in for   Chief Complaint  Patient presents with  . Atrial Fibrillation     HPI  Mark Moyer  is a 51 y.o. male, history of dyslipidemia who does not take medications due to lack of health insurance, who cycles several miles on a weekly basis, however for the last few months he has not cycled as he had an episode of chest discomfort with cycling few months ago, he has noticed that his exercise capacity has declined since then, today after dinner he was sitting on the couch when he had an episode of left-sided chest pressure and sharp pain radiating to his left arm making him sweat nauseated and short of breath, lasted for about half an hour, EMS arrived he received nitroglycerin and aspirin with chest pain relief, patient said that he had irregular heart beat in the ambulance, per EMS he was in atrial fibrillation but converted to sinus rhythm by itself.  In the ER patient had mildly elevated troponin, was placed on a heparin drip, cardiologist Dr. Herbie Baltimore was consulted who suggested a hospitalist admission.    Review of Systems  currently chest pain-free  In addition to the HPI above,  No Fever-chills, No Headache, No changes with Vision or hearing, No problems swallowing food or Liquids, No Chest pain, Cough or Shortness of Breath, No Abdominal pain, No Nausea or Vommitting, Bowel movements are regular, No Blood in stool or Urine, No dysuria, No new skin rashes or bruises, No new joints pains-aches,  No new weakness, tingling, numbness in any extremity, No recent weight gain or  loss, No polyuria, polydypsia or polyphagia, No significant Mental Stressors.  A full 10 point Review of Systems was done, except as stated above, all other Review of Systems were negative.   Social History History  Substance Use Topics  . Smoking status: Not on file  . Smokeless tobacco: Not on file  . Alcohol Use:  1-2 glasses of wine every night      Family History CAD in his father  Prior to Admission medications   Medication Sig Start Date End Date Taking? Authorizing Provider  aspirin EC 81 MG tablet Take 81 mg by mouth daily.   Yes Historical Provider, MD  atorvastatin (LIPITOR) 80 MG tablet Take 80 mg by mouth daily.   Yes Historical Provider, MD  desoximetasone (TOPICORT) 0.05 % cream Apply 1 application topically 2 (two) times daily.   Yes Historical Provider, MD    No Known Allergies  Physical Exam  Vitals  Blood pressure 127/95, pulse 70, temperature 98.4 F (36.9 C), temperature source Oral, resp. rate 20, SpO2 97.00%.   1. General middle-aged Caucasian male  lying in bed in NAD,    2. Normal affect and insight, Not Suicidal or Homicidal, Awake Alert, Oriented X 3.  3. No F.N deficits, ALL C.Nerves Intact, Strength 5/5 all 4 extremities, Sensation intact all 4 extremities, Plantars down going.  4. Ears and Eyes appear Normal, Conjunctivae clear, PERRLA. Moist Oral Mucosa.  5. Supple Neck, No JVD, No cervical lymphadenopathy appriciated, No Carotid Bruits.  6. Symmetrical Chest wall movement, Good air movement bilaterally, CTAB.  7. RRR, No Gallops, Rubs or Murmurs, No Parasternal Heave.  8. Positive Bowel Sounds, Abdomen Soft, Non tender, No organomegaly appriciated,No rebound -guarding or rigidity.  9.  No Cyanosis, Normal Skin Turgor, No Skin Rash or Bruise.  10. Good muscle tone,  joints appear normal , no effusions, Normal ROM.  11. No Palpable Lymph Nodes in Neck or Axillae    Data Review  CBC  Lab 02/27/12 2156  WBC 9.6  HGB 14.2    HCT 40.3  PLT 236  MCV 90.8  MCH 32.0  MCHC 35.2  RDW 13.1  LYMPHSABS 2.2  MONOABS 0.6  EOSABS 0.7  BASOSABS 0.1  BANDABS --   ------------------------------------------------------------------------------------------------------------------  Chemistries   Lab 02/27/12 2156  NA 141  K 3.4*  CL 104  CO2 26  GLUCOSE 120*  BUN 15  CREATININE 0.83  CALCIUM 9.5  MG --  AST --  ALT --  ALKPHOS --  BILITOT --    Imaging results:   No results found.  My personal review of EKG: Rhythm NSR, Rate  60 /min,  no Acute ST changes     Assessment & Plan   1. NSTEMI in a patient with history suggestive of unstable angina- plan is to admit the patient to step down, he is already received aspirin, will be placed on heparin drip, will be given beta blocker as tolerated by his heart rate, statin, nitro paste, Dr Herbie Baltimore from cardiology to see and further evaluate for future testing, he will be kept n.p.o. after midnight as may need cardiac catheterization. We'll get a baseline TSH, INR, A1c and lipid panel, will cycle troponin trend, obtain an echogram in the morning.   2. History of dyslipidemia-currently not taking any medications, check lipid panel in the morning continue home dose statin which he supposed to take.   3. Questionable atrial fibrillation in the ambulance-monitor on telemetry, low-dose beta blocker as tolerated by his heart rate, note patient is well conditioned and baseline resting heart rate is in 40s per patient, currently on heparin drip, cardiology to evaluate and recommend further long-term medications, TSH will be checked.   DVT Prophylaxis Heparin    AM Labs Ordered, also please review Full Orders  Family Communication: Admission, patients condition and plan of care including tests being ordered have been discussed with the patient   who indicates understanding and agree with the plan and Code Status.  Code Status Full  Disposition Plan: Home  Time  spent in minutes : 35  Condition Marinell Blight K M.D on 02/28/2012 at 12:28 AM  Between 7am to 7pm - Pager - [phone]  After 7pm go to [email or web address] - password [email or web address]  And look for the night coverage person covering me after hours  [facility] Office  [phone]

## 2012-02-28 NOTE — Progress Notes (Signed)
Pt having chest pain. NTG drip titrated, morphine given. VSS.Pt not completely pain free. Awaiting surgery consult. On call MD paged to notify. No return call. Will try again. Will continue to monitor.

## 2012-02-28 NOTE — CV Procedure (Signed)
[facility] PERCUTANEOUS CORONARY INTERVENTION REPORT  NAME:  Mark Moyer   MRN: [id number] DOB:  12-01-1960   ADMIT DATE: 02/27/2012  INTERVENTIONAL CARDIOLOGIST: Marykay Lex, M.D., MS PRIMARY CARE PROVIDER: Sheila Oats, MD PRIMARY CARDIOLOGIST:   PATIENT:  Mark Moyer is a 51 y.o. male with a family history of father having TIA followed by MI with CABG in his 66s as well as poorly controlled hyperlipidemia. He is otherwise healthy avid biker until about a month ago when he had exertional chest discomfort while biking. At that continued for several weeks and therefore he stopped liking. His outside chest discomfort with mowing his lawn but never at rest until the evening of the 02/27/2012 when he had the sudden onset of substernal chest discomfort radiating to his arm consistent with an acute coronary syndrome.  Cardia mass upon arrival he given aspirin and sublingual nitroglycerin and his pain resolved. He was admitted and placed on intravenous heparin and nitroglycerin paste as well as back on a statin. He was seen in consultation referred for cardiac catheterization as his troponins were elevated with a peak of 2.68 this morning. Consistent with non-ST relation MI.  PRE-OPERATIVE DIAGNOSIS:    Non-ST elevation MI  Poorly controlled hyperlipidemia  Preceding crescendo angina  PROCEDURES PERFORMED:    Left Heart Catheterization with Coronary Angiography via 5 French Right Radial Artery Access  Left Ventriculography in the RAO projection with 12 mL/sec for a total of 25 mm.  PROCEDURE: Consent: The procedure with Risks/Benefits/Alternatives and Indications was reviewed with the patient (and family).  All questions were answered.    Risks / Complications include, but not limited to: Death, MI, CVA/TIA, VF/VT (with defibrillation), Bradycardia (need for temporary pacer placement), contrast induced nephropathy, bleeding / bruising / hematoma /  pseudoaneurysm, vascular or coronary injury (with possible emergent CT or Vascular Surgery), adverse medication reactions, infection.    The patient (and family) voice understanding and agree to proceed.    Risks of procedure as well as the alternatives and risks of each were explained to the (patient/caregiver).  Consent for procedure obtained. Consent for signed by MD and patient with RN witness -- placed on chart.  PROCEDURE: The patient was brought to the 2nd Floor [facility] in the fasting state and prepped and draped in the usual sterile fashion for Right radial or groin access.  A modified Allen's test was performed with plethysmography revealing adequate Ulnar Artery flow.  Sterile technique was used including antiseptics, cap, gloves, gown, hand hygiene, mask and sheet.  Skin prep: Chlorhexidine;  Time Out: Verified patient identification, verified procedure, site/side was marked, verified correct patient position, special equipment/implants available, medications/allergies/relevent history reviewed, required imaging and test results available.  Performed  Access:  Right Radial Artery;  5 Fr Sheath - via Seldinger technique.  Diagnostic:  A TIG 4.0 was advanced over Versicore wire and they seem aorta. Intravenous heparin was administered weight based.  Left and Right Coronary Artery Angiography: TIG 4.0   The TIG 4.0 catheter was exchanged over long exchange safety J-wire for a angled pigtail catheter that was used to cross the aortic valve for Left Ventricular Hemodynamics and Left Ventriculography. Catheter was pulled back across the aortic valve for measurement pullback gradient.  he was admitted to the body or wire without difficulty.  TR Band:  1240 Hours, 15 mL air; nonocclusive hemostasis  Hemodynamics:  Central Aortic / Mean Pressures: 124/75 mmHg; 96 mmHg  Left Ventricular Pressures / EDP:  120/13 mmHg; 12 mmHg  Left Ventriculography:  EF:  50-55 %  Wall Motion: Anteroapical hypokinesis  Coronary Anatomy:  Left Main: Large caliber vessel bifurcates into LAD and circumflex. Distal 20-30% stenosis. LAD: Large caliber vessel with proximal 50 and 40% lesions prior to the first diagonal branch. After the first diagonal there is a focal 80% lesion followed by diffuse 30-40% irregularities distally. The vessel then red rash around the apex. There is extensive small septal perforators which provide collaterals to appear to be posterior descending artery and the right.  D1: Small moderate caliber vessel with diffuse luminal irregularities. Left Circumflex: Moderate to large caliber vessel gives rise to very high for first obtuse marginal branch. At this bifurcation there is a 1, 1, 1 bifurcation lesion with roughly 60% stenoses in each segment pre-and post in both vessels. The circumflex flex then continues on to give a second obtuse marginal branch and then into the AV groove where it gives off posterior lateral branches.  OM1: A moderate to large caliber vessel roughly same size as the circumflex with the ostial 60% of the described in a diffuse luminal irregularities at the courses as a Ramus distribution.  OM 2: This vessel is a moderate caliber vessel the AV groove it bifurcates into even size branches each of which have a roughly 60% lesion in each vessel.  RCA: Large caliber dominant vessel with a near ostial 60% lesion to catheter damping. This is followed by a very focal irregular likely culprit lesion of 95%. Then vessel and normalizes for little bit and is diffusely diseased in the midportion. There is one normal size marginal branch. The vessel does appear bifurcating 2 with appears to be a very small Posterior Descending Artery that seems to be occluded in the midportion.  RPDA: Very small in caliber and appear to be occluded in the midportion. Fills via collaterals to the left and septal perforators.  RPL Sysytem:The Right  Posterior AV Groove Branch small moderate caliber vessel diffusely diseased in the distal RCA and in this vessel. There is one major branch and several smaller branches. There major branch of the decent target for bypass.  ANESTHESIA:   Local Lidocaine 2 ml SEDATION:  5 mg IV Versed, 100 and mcg IV fentanyl ; Premedication: 5 mg by mouth Valium MEDICATIONS: Radial Cocktail: 5 mg Verapamil, 400 mcg NTG, 2 ml 2% Lidocaine in 10 ml NS   Anticoagulation: IV Heparin 4000 units  Anti-Platelet Agent: single bolus Integrilin administered in the Cath Lab with a drip initiated once the decision was made to proceed with CT surgery consultation.  Intracoronary nitroglycerin 200 mcg  was administered Into the Left Coronary Artery system.  Intravenous nitroglycerin infusion was initiated at 10 mcg per minute.  EBL:   < 5 ml  PATIENT DISPOSITION:    The patient was transferred to the PACU holding area in a hemodynamicaly stable, chest pain free condition.  The patient tolerated the procedure well, and there were no complications.  The patient was stable before, during, and after the procedure.  POST-OPERATIVE DIAGNOSIS:    Severe extensive coronary artery disease most notably with a focal 95% early mid RCA lesion with diffuse 60 to percent lesions proximally and distally, small occluded PDA, and 80% focal mid LAD with several areas of 60-70% lesion in the circumflex flex distribution.  Multiple lesions would make PCI less than favorable as an option.  Preserved left ventricular ejection fraction with normal LVEDP  PLAN OF CARE:  Continue Intravenous  Nitroglycerin and Integrilin, and restart IV Heparin 6 hours post TR band removal. We will try to keep the patient chest pain-free pending CABG evaluation.  I have counseled the Cardiac Surgery and will see the patient today.  Followup 2-D echocardiogram.  Continue statin and aspirin   HARDING,DAVID W, M.D., M.S. [facility] [address] [place], [place]  [address]  [phone]  02/28/2012 1:01 PM

## 2012-02-29 ENCOUNTER — Inpatient Hospital Stay (HOSPITAL_COMMUNITY): Payer: MEDICAID

## 2012-02-29 ENCOUNTER — Encounter (HOSPITAL_COMMUNITY): Payer: Self-pay | Admitting: Anesthesiology

## 2012-02-29 ENCOUNTER — Inpatient Hospital Stay (HOSPITAL_COMMUNITY): Payer: Self-pay

## 2012-02-29 DIAGNOSIS — Z01810 Encounter for preprocedural cardiovascular examination: Secondary | ICD-10-CM

## 2012-02-29 DIAGNOSIS — I2510 Atherosclerotic heart disease of native coronary artery without angina pectoris: Secondary | ICD-10-CM

## 2012-02-29 LAB — URINALYSIS, ROUTINE W REFLEX MICROSCOPIC
Bilirubin Urine: NEGATIVE
Glucose, UA: NEGATIVE mg/dL
Hgb urine dipstick: NEGATIVE
Ketones, ur: NEGATIVE mg/dL
Leukocytes, UA: NEGATIVE
Nitrite: NEGATIVE
Protein, ur: NEGATIVE mg/dL
Specific Gravity, Urine: 1.009 (ref 1.005–1.030)
Urobilinogen, UA: 0.2 mg/dL (ref 0.0–1.0)
pH: 7.5 (ref 5.0–8.0)

## 2012-02-29 LAB — PULMONARY FUNCTION TEST

## 2012-02-29 LAB — BLOOD GAS, ARTERIAL
Acid-Base Excess: 2.3 mmol/L — ABNORMAL HIGH (ref 0.0–2.0)
Bicarbonate: 25.5 mEq/L — ABNORMAL HIGH (ref 20.0–24.0)
Drawn by: 31276
FIO2: 0.21 %
O2 Saturation: 98 %
Patient temperature: 98.6
TCO2: 26.5 mmol/L (ref 0–100)
pCO2 arterial: 33.7 mmHg — ABNORMAL LOW (ref 35.0–45.0)
pH, Arterial: 7.492 — ABNORMAL HIGH (ref 7.350–7.450)
pO2, Arterial: 97.9 mmHg (ref 80.0–100.0)

## 2012-02-29 LAB — TYPE AND SCREEN
ABO/RH(D): A POS
Antibody Screen: NEGATIVE

## 2012-02-29 LAB — CBC
HCT: 35.9 % — ABNORMAL LOW (ref 39.0–52.0)
HCT: 38 % — ABNORMAL LOW (ref 39.0–52.0)
Hemoglobin: 13.3 g/dL (ref 13.0–17.0)
MCH: 31.8 pg (ref 26.0–34.0)
MCH: 31.9 pg (ref 26.0–34.0)
MCHC: 35 g/dL (ref 30.0–36.0)
MCV: 90.7 fL (ref 78.0–100.0)
MCV: 91.1 fL (ref 78.0–100.0)
Platelets: 225 10*3/uL (ref 150–400)
Platelets: 233 10*3/uL (ref 150–400)
RBC: 3.96 MIL/uL — ABNORMAL LOW (ref 4.22–5.81)
RBC: 4.17 MIL/uL — ABNORMAL LOW (ref 4.22–5.81)
RDW: 13.4 % (ref 11.5–15.5)
WBC: 9.8 10*3/uL (ref 4.0–10.5)
WBC: 13.3 10*3/uL — ABNORMAL HIGH (ref 4.0–10.5)

## 2012-02-29 LAB — COMPREHENSIVE METABOLIC PANEL
ALT: 12 U/L (ref 0–53)
AST: 46 U/L — ABNORMAL HIGH (ref 0–37)
Albumin: 4 g/dL (ref 3.5–5.2)
Alkaline Phosphatase: 50 U/L (ref 39–117)
BUN: 10 mg/dL (ref 6–23)
CO2: 24 mEq/L (ref 19–32)
Calcium: 9.2 mg/dL (ref 8.4–10.5)
Chloride: 104 mEq/L (ref 96–112)
Creatinine, Ser: 0.92 mg/dL (ref 0.50–1.35)
GFR calc Af Amer: >90 mL/min (ref >90)
GFR calc non Af Amer: >90 mL/min (ref >90)
Glucose, Bld: 116 mg/dL — ABNORMAL HIGH (ref 70–99)
Potassium: 3.6 mEq/L (ref 3.5–5.1)
Sodium: 141 mEq/L (ref 135–145)
Total Bilirubin: 0.9 mg/dL (ref 0.3–1.2)
Total Protein: 7.1 g/dL (ref 6.0–8.3)

## 2012-02-29 LAB — HEPARIN LEVEL (UNFRACTIONATED)
Heparin Unfractionated: <0.1 IU/mL — ABNORMAL LOW (ref 0.30–0.70)
Heparin Unfractionated: 0.27 IU/mL — ABNORMAL LOW (ref 0.30–0.70)

## 2012-02-29 LAB — PROTIME-INR
INR: 1.01 (ref 0.00–1.49)
Prothrombin Time: 13.5 seconds (ref 11.6–15.2)

## 2012-02-29 LAB — APTT: aPTT: 92 seconds — ABNORMAL HIGH (ref 24–37)

## 2012-02-29 MED ORDER — EPINEPHRINE HCL 1 MG/ML IJ SOLN
0.5000 ug/min | INTRAMUSCULAR | Status: DC
  Filled 2012-02-29: qty 4

## 2012-02-29 MED ORDER — SODIUM BICARBONATE 8.4 % IV SOLN
INTRAVENOUS | Status: AC
  Administered 2012-03-01: 10:00:00
  Filled 2012-02-29 (×2): qty 2.5

## 2012-02-29 MED ORDER — CHLORHEXIDINE GLUCONATE 4 % EX LIQD
60.0000 mL | Freq: Once | CUTANEOUS | Status: AC
  Administered 2012-03-01: 4 via TOPICAL

## 2012-02-29 MED ORDER — METOPROLOL TARTRATE 12.5 MG HALF TABLET
12.5000 mg | ORAL_TABLET | Freq: Once | ORAL | Status: DC
  Filled 2012-02-29: qty 1

## 2012-02-29 MED ORDER — MAGNESIUM SULFATE 50 % IJ SOLN
40.0000 meq | INTRAMUSCULAR | Status: DC
  Filled 2012-02-29: qty 10

## 2012-02-29 MED ORDER — NITROGLYCERIN IN D5W 200-5 MCG/ML-% IV SOLN: 2.0000 ug/min | INTRAVENOUS | Status: DC

## 2012-02-29 MED ORDER — BISACODYL 5 MG PO TBEC
5.0000 mg | DELAYED_RELEASE_TABLET | Freq: Once | ORAL | Status: AC
  Administered 2012-02-29: 5 mg via ORAL
  Filled 2012-02-29: qty 1

## 2012-02-29 MED ORDER — HEPARIN (PORCINE) IN NACL 100-0.45 UNIT/ML-% IJ SOLN
2000.0000 [IU]/h | INTRAMUSCULAR | Status: DC
  Filled 2012-02-29 (×3): qty 250

## 2012-02-29 MED ORDER — DEXTROSE 5 % IV SOLN
1.5000 g | INTRAVENOUS | Status: AC
  Administered 2012-03-01: 1.5 g via INTRAVENOUS
  Administered 2012-03-01: .75 g via INTRAVENOUS
  Filled 2012-02-29: qty 1.5

## 2012-02-29 MED ORDER — TRANEXAMIC ACID 100 MG/ML IV SOLN
1.5000 mg/kg/h | INTRAVENOUS | Status: AC
  Administered 2012-03-01: 1.5 mg/kg/h via INTRAVENOUS
  Filled 2012-02-29: qty 25

## 2012-02-29 MED ORDER — SODIUM CHLORIDE 0.9 % IV SOLN
INTRAVENOUS | Status: AC
  Administered 2012-03-01: 1.6 [IU]/h via INTRAVENOUS
  Filled 2012-02-29: qty 1

## 2012-02-29 MED ORDER — POTASSIUM CHLORIDE 2 MEQ/ML IV SOLN
80.0000 meq | INTRAVENOUS | Status: DC
  Filled 2012-02-29: qty 40

## 2012-02-29 MED ORDER — TRANEXAMIC ACID (OHS) BOLUS VIA INFUSION
15.0000 mg/kg | INTRAVENOUS | Status: AC
  Administered 2012-03-01: 1276.5 mg via INTRAVENOUS
  Filled 2012-02-29: qty 1277

## 2012-02-29 MED ORDER — TRANEXAMIC ACID (OHS) PUMP PRIME SOLUTION
2.0000 mg/kg | INTRAVENOUS | Status: DC
  Filled 2012-02-29: qty 1.7

## 2012-02-29 MED ORDER — DOPAMINE-DEXTROSE 3.2-5 MG/ML-% IV SOLN
2.0000 ug/kg/min | INTRAVENOUS | Status: DC
  Filled 2012-02-29: qty 250

## 2012-02-29 MED ORDER — DEXTROSE 5 % IV SOLN
750.0000 mg | INTRAVENOUS | Status: DC
  Filled 2012-02-29 (×2): qty 750

## 2012-02-29 MED ORDER — VANCOMYCIN HCL 1000 MG IV SOLR
1500.0000 mg | INTRAVENOUS | Status: AC
  Administered 2012-03-01: 1500 mg via INTRAVENOUS
  Filled 2012-02-29: qty 1500

## 2012-02-29 MED ORDER — DEXMEDETOMIDINE HCL IN NACL 400 MCG/100ML IV SOLN
0.1000 ug/kg/h | INTRAVENOUS | Status: AC
  Administered 2012-03-01: .3 ug/kg/h via INTRAVENOUS
  Filled 2012-02-29: qty 100

## 2012-02-29 MED ORDER — TEMAZEPAM 15 MG PO CAPS
15.0000 mg | ORAL_CAPSULE | Freq: Once | ORAL | Status: AC | PRN
  Administered 2012-02-29: 15 mg via ORAL
  Filled 2012-02-29: qty 1

## 2012-02-29 MED ORDER — DEXTROSE 5 % IV SOLN
30.0000 ug/min | INTRAVENOUS | Status: AC
  Administered 2012-03-01: 10 ug/min via INTRAVENOUS
  Filled 2012-02-29: qty 2

## 2012-02-29 NOTE — Progress Notes (Addendum)
VASCULAR LAB PRELIMINARY  PRELIMINARY  PRELIMINARY  PRELIMINARY  Pre-op Cardiac Surgery  Carotid Findings:  No evidence of significant ICA stenosis and vertebral artery flow is antegrade bilaterally.    Upper Extremity Right Left  Brachial Pressures 168-Triphasic 172-Triphasic  Radial Waveforms Triphasic Triphasic  Ulnar Waveforms Triphasic Triphasic  Palmar Arch (Allen's Test) Signal obliterates with radial compression, is unaffected by ulnar compression. Signal obliterates with radial compression, is unaffected by ulnar compression.     Lower  Extremity Right Left  Dorsalis Pedis    Anterior Tibial    Posterior Tibial    Ankle/Brachial Indices      Findings:  Palpable pedal pulses x 4.     Mark Moyer, 02/29/2012, 12:29 PM  02/29/2012 4:34 PM Mark Moyer, RDMS, RDCS

## 2012-02-29 NOTE — Progress Notes (Signed)
Heparin per Pharmacy  Heparin level = 0.37 on 1950 units/hr Goal heparin level = 0.3-0.5  (on Integrilin)  Heparin level is slightly below desired range. Will increase the heparin rate to 2000 units/hr. Heparin is scheduled to be turned off at 0700. Pt for CABG tomorrow.  Cardell Peach, PharmD

## 2012-02-29 NOTE — Progress Notes (Signed)
Patient ID: Mark Moyer, male   DOB: Nov 07, 1960, 51 y.o.   MRN: [id number] [facility] DAILY PROGRESS NOTE                   [address]            Mark Moyer [id number]          [phone]      1 Day Post-Op Procedure(s) (LRB): LEFT HEART CATHETERIZATION WITH CORONARY ANGIOGRAM (N/A)  Total Length of Stay:  LOS: 2 days   Subjective: No chest pain today, for cabg tomorrow  Objective: Vital signs in last 24 hours: Temp:  [98.1 F (36.7 C)-98.6 F (37 C)] 98.1 F (36.7 C) (08/13 1200) Pulse Rate:  [43-53] 46  (08/13 1400) Cardiac Rhythm:  [-] Sinus bradycardia (08/13 1200) Resp:  [10-19] 18  (08/13 1400) BP: (107-149)/(57-96) 120/70 mmHg (08/13 1400) SpO2:  [95 %-100 %] 100 % (08/13 1400)  Filed Weights   02/28/12 0224  Weight: 187 lb 9.8 oz (85.1 kg)    Weight change:    Hemodynamic parameters for last 24 hours:    Intake/Output from previous day: 08/12 0701 - 08/13 0700 In: 1979.1 [P.O.:240; I.V.:1739.1] Out: 2500 [Urine:2500]  Intake/Output this shift: Total I/O In: 921.3 [P.O.:560; I.V.:361.3] Out: -   Current Meds: Scheduled Meds:   . aspirin  325 mg Oral Daily  . atorvastatin  80 mg Oral q1800  . bisacodyl  5 mg Oral Once  . cefUROXime (ZINACEF)  IV  1.5 g Intravenous To OR  . cefUROXime (ZINACEF)  IV  750 mg Intravenous To OR  . chlorhexidine  60 mL Topical Once  . dexmedetomidine  0.1-0.7 mcg/kg/hr Intravenous To OR  . DOPamine  2-20 mcg/kg/min Intravenous To OR  . epinephrine  0.5-20 mcg/min Intravenous To OR  . insulin (NOVOLIN-R) infusion   Intravenous To OR  . magnesium sulfate  40 mEq Other To OR  . metoprolol tartrate  12.5 mg Oral BID  . metoprolol tartrate  12.5 mg Oral Once  . nitroGLYCERIN  2-200 mcg/min Intravenous To OR  . nitroglycerin-nicardipine-HEPARIN-sodium bicarbonate irrigation for artery spasm   Irrigation To OR  . phenylephrine (NEO-SYNEPHRINE) Adult infusion  30-200 mcg/min Intravenous To OR  . potassium  chloride  80 mEq Other To OR  . sodium chloride  3 mL Intravenous Q12H  . tranexamic acid  15 mg/kg Intravenous To OR  . tranexamic acid  2 mg/kg Intracatheter To OR  . tranexamic acid (CYKLOKAPRON) infusion ([facility])  1.5 mg/kg/hr Intravenous To OR  . vancomycin  1,500 mg Intravenous To OR   Continuous Infusions:   . sodium chloride 1 mL/kg/hr (02/28/12 1415)  . sodium chloride 250 mL (02/28/12 2100)  . eptifibatide 2 mcg/kg/min (02/29/12 1517)  . heparin    . nitroGLYCERIN 10 mcg/min (02/29/12 1300)  . DISCONTD: heparin 1,650 Units/hr (02/29/12 0640)   PRN Meds:.acetaminophen, albuterol, guaiFENesin-dextromethorphan, HYDROcodone-acetaminophen, morphine injection, morphine injection, ondansetron (ZOFRAN) IV, sodium chloride, temazepam  General appearance: alert and cooperative Neurologic: intact Heart: regular rate and rhythm, S1, S2 normal, no murmur, click, rub or gallop and normal apical impulse Lungs: clear to auscultation bilaterally and normal percussion bilaterally Abdomen: soft, non-tender; bowel sounds normal; no masses,  no organomegaly Extremities: extremities normal, atraumatic, no cyanosis or edema and Homans sign is negative, no sign of DVT Patient rt handed, radial primary supply to hand bilaterally by doppler No carotidobstruction  Lab Results: CBC: Basename 02/29/12 0538 02/28/12 2116  WBC 9.8 10.8*  HGB 12.6* 12.6*  HCT 35.9* 36.3*  PLT 225 214   BMET:  Basename 02/28/12 0249 02/27/12 2156  NA 138 141  K 3.8 3.4*  CL 106 104  CO2 22 26  GLUCOSE 107* 120*  BUN 13 15  CREATININE 0.71 0.83  CALCIUM 8.8 9.5    PT/INR:  Basename 02/28/12 0249  LABPROT 14.9  INR 1.15   Radiology: No results found.   Assessment/Plan: S/P Procedure(s) (LRB): LEFT HEART CATHETERIZATION WITH CORONARY ANGIOGRAM (N/A) For cabg tomorrow The goals risks and alternatives of the planned surgical procedure CABG  have been discussed with the patient in detail. The risks of the  procedure including death, infection, stroke, myocardial infarction, bleeding, blood transfusion have all been discussed specifically.  I have quoted Mark Moyer a 2 % of perioperative mortality and a complication rate as high as 20 %. The patient's questions have been answered.Mark Moyer is willing  to proceed with the planned procedure.     Delight Ovens MD  Beeper [phone] Office [phone] 02/29/2012 4:57 PM

## 2012-02-29 NOTE — Progress Notes (Signed)
PFT done at bedside. Unconfirmed copy placed in shadow chart.  Inocente Salles RRT, RCP 02/29/2012 10:36 AM

## 2012-02-29 NOTE — Progress Notes (Signed)
ANTICOAGULATION CONSULT NOTE   Pharmacy Consult for heparin / Integrilin Indication: chest pain / ACS  No Known Allergies  Patient Measurements: Height: 5' 11.5" (181.6 cm) Weight: 187 lb 9.8 oz (85.1 kg) IBW/kg (Calculated) : 76.45  Heparin Dosing Weight: 85 kg   Vital Signs: Temp: 98.5 F (36.9 C) (08/13 0400) Temp src: Oral (08/13 0400) BP: 115/69 mmHg (08/13 0500) Pulse Rate: 52  (08/13 0500)  Labs:  Basename 02/29/12 0538 02/28/12 2116 02/28/12 1001 02/28/12 0500 02/28/12 0249 02/28/12 0046 02/27/12 2156  HGB 12.6* 12.6* -- -- -- -- --  HCT 35.9* 36.3* -- -- 36.8* -- --  PLT 225 214 -- -- 229 -- --  APTT -- -- -- -- -- -- --  LABPROT -- -- -- -- 14.9 -- --  INR -- -- -- -- 1.15 -- --  HEPARINUNFRC <0.10* -- -- <0.10* -- -- --  CREATININE -- -- -- -- 0.71 -- 0.83  CKTOTAL -- -- 144 -- 125 -- --  CKMB -- -- 10.0* -- 8.4* -- --  TROPONINI -- -- 2.68* -- 1.70* 2.53* --    Estimated Creatinine Clearance: 119.5 ml/min (by C-G formula based on Cr of 0.71).  Assessment: 51 yo male with NSTEMI s/p cath and plan for CABG on Wednesday. Patient is currently on IV heparin and Integrilin until CABG.  Heparin level (< 0.1) is below-goal on 1350 units/hr. No problem with line / infusion per RN.   Goal of Therapy:  Heparin level = 0.3-0.5 Monitor platelets by anticoagulation protocol: Yes   Plan:  1. Increase IV heparin to 1650 units/hr. 2. Heparin level in 6 hours.  3. Continue Integrilin at 2 mcg/kg/min.   Lorre Munroe, PharmD 02/29/2012 6:27 AM

## 2012-02-29 NOTE — Progress Notes (Signed)
Subjective: No pain  Objective: Vital signs in last 24 hours: Temp:  [98.2 F (36.8 C)-98.6 F (37 C)] 98.5 F (36.9 C) (08/13 0400) Pulse Rate:  [43-53] 51  (08/13 0700) Resp:  [10-19] 10  (08/13 0700) BP: (107-175)/(57-103) 115/59 mmHg (08/13 0700) SpO2:  [95 %-100 %] 98 % (08/13 0700) Weight change:  Last BM Date: 02/27/12 Intake/Output from previous day: -436 08/12 0701 - 08/13 0700 In: 1977.1 [P.O.:240; I.V.:1737.1] Out: 1600 [Urine:1600] Intake/Output this shift: Total I/O In: 42.6 [I.V.:42.6] Out: -   PE:  General appearance: alert, cooperative and no distress  Lungs: clear to auscultation bilaterally  Heart: regular rate and rhythm, S1, S2 normal, no murmur, click, rub or gallop  Extremities: No LEE  Pulses: 2+ and symmetric  Skin: Warm and Dry  Neurologic: Grossly normal  Lab Results:  Basename 02/29/12 0538 02/28/12 2116  WBC 9.8 10.8*  HGB 12.6* 12.6*  HCT 35.9* 36.3*  PLT 225 214   BMET  Basename 02/28/12 0249 02/27/12 2156  NA 138 141  K 3.8 3.4*  CL 106 104  CO2 22 26  GLUCOSE 107* 120*  BUN 13 15  CREATININE 0.71 0.83  CALCIUM 8.8 9.5    Basename 02/28/12 1001 02/28/12 0249  TROPONINI 2.68* 1.70*    Lab Results  Component Value Date   CHOL 259* 02/28/2012   HDL 43 02/28/2012   LDLCALC 177* 02/28/2012   TRIG 194* 02/28/2012   CHOLHDL 6.0 02/28/2012   Lab Results  Component Value Date   HGBA1C 5.8* 02/28/2012     Lab Results  Component Value Date   TSH 4.061 02/28/2012    Hepatic Function Panel No results found for this basename: PROT,ALBUMIN,AST,ALT,ALKPHOS,BILITOT,BILIDIR,IBILI in the last 72 hours  Basename 02/28/12 0500  CHOL 259*   No results found for this basename: PROTIME in the last 72 hours    EKG: Orders placed during the hospital encounter of 02/27/12  . EKG 12-LEAD  . EKG 12-LEAD    Studies/Results: 2D Echo - Left ventricle: The cavity size was normal. Systolic function was mildly reduced. The estimated  ejection fraction was in the range of 45% to 50%. Hypokinesis of the mid-distalanteroseptal and apical myocardium. Left ventricular diastolic function parameters were normal. - Left atrium: The atrium was mildly dilated. - Right ventricle: The cavity size was mildly dilated. Wall thickness was normal. - Atrial septum: No defect or patent foramen ovale was identified.    Medications: I have reviewed the patient's current medications.    Marland Kitchen aspirin  325 mg Oral Daily  . atorvastatin  80 mg Oral q1800  . diazepam  5 mg Oral On Call  . eptifibatide      . fentaNYL      . heparin      . lidocaine      . metoprolol tartrate  12.5 mg Oral BID  . midazolam      . midazolam      . midazolam      . nitroGLYCERIN      . nitroGLYCERIN      . sodium chloride  3 mL Intravenous Q12H  . verapamil      . DISCONTD: nitroGLYCERIN  1 inch Topical Q6H  . DISCONTD: sodium chloride  3 mL Intravenous Q12H  . DISCONTD: sodium chloride  3 mL Intravenous Q12H   Assessment/Plan: Principal Problem:  *NSTEMI (non-ST elevated myocardial infarction) Active Problems:  Dyslipidemia  Atrial fibrillation with rapid ventricular response - converted spontaneously with resoluition of  Chest pain after NTG  PLAN: CABG on Wed. Awaiting carotid dopplers  Dr. Royann Shivers has seen and examined.   LOS: 2 days   INGOLD,LAURA R 02/29/2012, 8:17 AM  I have seen and examined the patient along with Nada Boozer, NP.  I have reviewed the chart, notes and new data.  I agree with NP's note.  Key new complaints: headache due to NTG; no CP since last night Key examination changes: no CHF or arrhythmia Key new findings / data: echo shows wall motion abnormalities in the LAD territory and mildly decreased EF.  PLAN: CABG in AM>  Thurmon Fair, MD, [facility] [phone] 02/29/2012, 8:26 AM

## 2012-02-29 NOTE — Plan of Care (Signed)
Problem: Phase II Progression Outcomes Goal: Other Phase II Outcomes/Goals Outcome: Completed/Met Date Met:  02/29/12 Changed to Cardiac Surgery Clinical Path.

## 2012-02-29 NOTE — Progress Notes (Signed)
ANTICOAGULATION CONSULT NOTE   Pharmacy Consult for heparin / Integrilin Indication: chest pain / ACS  No Known Allergies  Patient Measurements: Height: 5' 11.5" (181.6 cm) Weight: 187 lb 9.8 oz (85.1 kg) IBW/kg (Calculated) : 76.45  Heparin Dosing Weight: 85 kg   Vital Signs: Temp: 98.4 F (36.9 C) (08/13 0700) Temp src: Oral (08/13 0700) BP: 125/65 mmHg (08/13 1200) Pulse Rate: 51  (08/13 1200)  Labs:  Basename 02/29/12 1139 02/29/12 0538 02/28/12 2116 02/28/12 1001 02/28/12 0500 02/28/12 0249 02/28/12 0046 02/27/12 2156  HGB -- 12.6* 12.6* -- -- -- -- --  HCT -- 35.9* 36.3* -- -- 36.8* -- --  PLT -- 225 214 -- -- 229 -- --  APTT -- -- -- -- -- -- -- --  LABPROT -- -- -- -- -- 14.9 -- --  INR -- -- -- -- -- 1.15 -- --  HEPARINUNFRC <0.10* <0.10* -- -- <0.10* -- -- --  CREATININE -- -- -- -- -- 0.71 -- 0.83  CKTOTAL -- -- -- 144 -- 125 -- --  CKMB -- -- -- 10.0* -- 8.4* -- --  TROPONINI -- -- -- 2.68* -- 1.70* 2.53* --    Estimated Creatinine Clearance: 119.5 ml/min (by C-G formula based on Cr of 0.71).  Assessment: 51 yo male with NSTEMI s/p cath and plan for CABG on Wednesday. Patient is currently on IV heparin and Integrilin until CABG.  Heparin level (< 0.1) is still below-goal on 1650 units/hr. No problem with line / infusion per RN.  The heparin bag was changed this am.  Goal of Therapy:  Heparin level = 0.3-0.5 Monitor platelets by anticoagulation protocol: Yes   Plan:  1. Increase IV heparin to 1950 units/hr 2. Heparin level in 6 hours.  3. Continue Integrilin at 2 mcg/kg/min.   Harland German, Pharm D 02/29/2012 1:03 PM

## 2012-03-01 ENCOUNTER — Inpatient Hospital Stay (HOSPITAL_COMMUNITY): Payer: MEDICAID

## 2012-03-01 ENCOUNTER — Inpatient Hospital Stay (HOSPITAL_COMMUNITY): Payer: MEDICAID | Admitting: Anesthesiology

## 2012-03-01 ENCOUNTER — Encounter (HOSPITAL_COMMUNITY): Payer: Self-pay | Admitting: Anesthesiology

## 2012-03-01 ENCOUNTER — Encounter (HOSPITAL_COMMUNITY)
Admission: EM | Disposition: A | Discharge status: Home / Self Care | Payer: Self-pay | Source: Home / Self Care | Attending: Cardiothoracic Surgery

## 2012-03-01 DIAGNOSIS — I2510 Atherosclerotic heart disease of native coronary artery without angina pectoris: Secondary | ICD-10-CM

## 2012-03-01 HISTORY — PX: CORONARY ARTERY BYPASS GRAFT: SHX141

## 2012-03-01 HISTORY — PX: TEE WITHOUT CARDIOVERSION: SHX5443

## 2012-03-01 LAB — POCT I-STAT 4, (NA,K, GLUC, HGB,HCT)
Glucose, Bld: 135 mg/dL — ABNORMAL HIGH (ref 70–99)
Glucose, Bld: 132 mg/dL — ABNORMAL HIGH (ref 70–99)
Glucose, Bld: 135 mg/dL — ABNORMAL HIGH (ref 70–99)
Glucose, Bld: 111 mg/dL — ABNORMAL HIGH (ref 70–99)
HCT: 44 % (ref 39.0–52.0)
HCT: 33 % — ABNORMAL LOW (ref 39.0–52.0)
HCT: 30 % — ABNORMAL LOW (ref 39.0–52.0)
HCT: 25 % — ABNORMAL LOW (ref 39.0–52.0)
HCT: 32 % — ABNORMAL LOW (ref 39.0–52.0)
Hemoglobin: 13.6 g/dL (ref 13.0–17.0)
Hemoglobin: 15 g/dL (ref 13.0–17.0)
Hemoglobin: 11.2 g/dL — ABNORMAL LOW (ref 13.0–17.0)
Hemoglobin: 8.5 g/dL — ABNORMAL LOW (ref 13.0–17.0)
Potassium: 3.7 mEq/L (ref 3.5–5.1)
Potassium: 3.9 mEq/L (ref 3.5–5.1)
Potassium: 3.4 mEq/L — ABNORMAL LOW (ref 3.5–5.1)
Potassium: 3.6 mEq/L (ref 3.5–5.1)
Sodium: 140 mEq/L (ref 135–145)
Sodium: 140 mEq/L (ref 135–145)
Sodium: 138 mEq/L (ref 135–145)
Sodium: 141 mEq/L (ref 135–145)

## 2012-03-01 LAB — POCT I-STAT 3, ART BLOOD GAS (G3+)
Acid-Base Excess: 3 mmol/L — ABNORMAL HIGH (ref 0.0–2.0)
Acid-Base Excess: 1 mmol/L (ref 0.0–2.0)
Acid-Base Excess: 1 mmol/L (ref 0.0–2.0)
Acid-base deficit: 1 mmol/L (ref 0.0–2.0)
Bicarbonate: 29.6 mEq/L — ABNORMAL HIGH (ref 20.0–24.0)
Bicarbonate: 25.3 mEq/L — ABNORMAL HIGH (ref 20.0–24.0)
Bicarbonate: 24.1 mEq/L — ABNORMAL HIGH (ref 20.0–24.0)
Bicarbonate: 25.5 mEq/L — ABNORMAL HIGH (ref 20.0–24.0)
O2 Saturation: 100 %
O2 Saturation: 95 %
O2 Saturation: 99 %
Patient temperature: 35.4
Patient temperature: 36.7
Patient temperature: 36.41
TCO2: 26 mmol/L (ref 0–100)
TCO2: 24 mmol/L (ref 0–100)
TCO2: 26 mmol/L (ref 0–100)
TCO2: 25 mmol/L (ref 0–100)
TCO2: 27 mmol/L (ref 0–100)
pCO2 arterial: 52.6 mmHg — ABNORMAL HIGH (ref 35.0–45.0)
pCO2 arterial: 36.9 mmHg (ref 35.0–45.0)
pH, Arterial: 7.348 — ABNORMAL LOW (ref 7.350–7.450)
pO2, Arterial: 467 mmHg — ABNORMAL HIGH (ref 80.0–100.0)
pO2, Arterial: 327 mmHg — ABNORMAL HIGH (ref 80.0–100.0)

## 2012-03-01 LAB — BASIC METABOLIC PANEL
BUN: 11 mg/dL (ref 6–23)
CO2: 26 mEq/L (ref 19–32)
Calcium: 9.6 mg/dL (ref 8.4–10.5)
Chloride: 103 mEq/L (ref 96–112)
Creatinine, Ser: 0.8 mg/dL (ref 0.50–1.35)
GFR calc Af Amer: >90 mL/min (ref >90)
GFR calc non Af Amer: >90 mL/min (ref >90)
Glucose, Bld: 107 mg/dL — ABNORMAL HIGH (ref 70–99)
Potassium: 3.5 mEq/L (ref 3.5–5.1)
Sodium: 142 mEq/L (ref 135–145)

## 2012-03-01 LAB — PROTIME-INR: INR: 1.37 (ref 0.00–1.49)

## 2012-03-01 LAB — GLUCOSE, CAPILLARY
Glucose-Capillary: 139 mg/dL — ABNORMAL HIGH (ref 70–99)
Glucose-Capillary: 127 mg/dL — ABNORMAL HIGH (ref 70–99)
Glucose-Capillary: 125 mg/dL — ABNORMAL HIGH (ref 70–99)

## 2012-03-01 LAB — SURGICAL PCR SCREEN: MRSA, PCR: NEGATIVE

## 2012-03-01 LAB — CBC
HCT: 42 % (ref 39.0–52.0)
HCT: 30.1 % — ABNORMAL LOW (ref 39.0–52.0)
HCT: 29.2 % — ABNORMAL LOW (ref 39.0–52.0)
Hemoglobin: 14.4 g/dL (ref 13.0–17.0)
Hemoglobin: 10.8 g/dL — ABNORMAL LOW (ref 13.0–17.0)
Hemoglobin: 10.3 g/dL — ABNORMAL LOW (ref 13.0–17.0)
MCH: 31.3 pg (ref 26.0–34.0)
MCH: 31.6 pg (ref 26.0–34.0)
MCHC: 34.3 g/dL (ref 30.0–36.0)
MCHC: 35.3 g/dL (ref 30.0–36.0)
MCV: 91.3 fL (ref 78.0–100.0)
MCV: 90.1 fL (ref 78.0–100.0)
MCV: 89.6 fL (ref 78.0–100.0)
Platelets: 151 10*3/uL (ref 150–400)
RBC: 4.6 MIL/uL (ref 4.22–5.81)
RBC: 3.34 MIL/uL — ABNORMAL LOW (ref 4.22–5.81)
RBC: 3.26 MIL/uL — ABNORMAL LOW (ref 4.22–5.81)
RDW: 13.1 % (ref 11.5–15.5)
WBC: 13.8 10*3/uL — ABNORMAL HIGH (ref 4.0–10.5)
WBC: 13.6 10*3/uL — ABNORMAL HIGH (ref 4.0–10.5)

## 2012-03-01 LAB — APTT: aPTT: 32 seconds (ref 24–37)

## 2012-03-01 LAB — HEMOGLOBIN A1C
Hgb A1c MFr Bld: 5.6 % (ref <5.7)
Mean Plasma Glucose: 114 mg/dL (ref <117)

## 2012-03-01 LAB — POCT I-STAT, CHEM 8
BUN: 8 mg/dL (ref 6–23)
Calcium, Ion: 1.18 mmol/L (ref 1.12–1.23)
Chloride: 105 mEq/L (ref 96–112)
Creatinine, Ser: 0.7 mg/dL (ref 0.50–1.35)
TCO2: 23 mmol/L (ref 0–100)

## 2012-03-01 LAB — HEMOGLOBIN AND HEMATOCRIT, BLOOD
HCT: 28.8 % — ABNORMAL LOW (ref 39.0–52.0)
Hemoglobin: 10.3 g/dL — ABNORMAL LOW (ref 13.0–17.0)

## 2012-03-01 LAB — CREATININE, SERUM
Creatinine, Ser: 0.67 mg/dL (ref 0.50–1.35)
GFR calc Af Amer: >90 mL/min (ref >90)
GFR calc non Af Amer: >90 mL/min (ref >90)

## 2012-03-01 LAB — MAGNESIUM: Magnesium: 2.8 mg/dL — ABNORMAL HIGH (ref 1.5–2.5)

## 2012-03-01 LAB — PLATELET COUNT: Platelets: 142 10*3/uL — ABNORMAL LOW (ref 150–400)

## 2012-03-01 SURGERY — CORONARY ARTERY BYPASS GRAFTING (CABG)
Anesthesia: General | Site: Chest | Wound class: Clean

## 2012-03-01 MED ORDER — FAMOTIDINE IN NACL 20-0.9 MG/50ML-% IV SOLN
20.0000 mg | Freq: Two times a day (BID) | INTRAVENOUS | Status: AC
  Administered 2012-03-01: 20 mg via INTRAVENOUS

## 2012-03-01 MED ORDER — SODIUM CHLORIDE 0.9 % IJ SOLN
3.0000 mL | Freq: Two times a day (BID) | INTRAMUSCULAR | Status: DC
  Administered 2012-03-02 (×2): 3 mL via INTRAVENOUS

## 2012-03-01 MED ORDER — SODIUM CHLORIDE 0.9 % IV SOLN
INTRAVENOUS | Status: DC
  Administered 2012-03-01: 20 mL/h via INTRAVENOUS

## 2012-03-01 MED ORDER — SODIUM CHLORIDE 0.45 % IV SOLN
INTRAVENOUS | Status: DC
  Administered 2012-03-01: 20 mL/h via INTRAVENOUS

## 2012-03-01 MED ORDER — ACETAMINOPHEN 500 MG PO TABS
1000.0000 mg | ORAL_TABLET | Freq: Four times a day (QID) | ORAL | Status: DC
  Administered 2012-03-02 – 2012-03-03 (×4): 1000 mg via ORAL
  Filled 2012-03-01 (×9): qty 2

## 2012-03-01 MED ORDER — LIDOCAINE HCL (CARDIAC) 20 MG/ML IV SOLN
INTRAVENOUS | Status: DC | PRN
  Administered 2012-03-01: 100 mg via INTRAVENOUS

## 2012-03-01 MED ORDER — MORPHINE SULFATE 2 MG/ML IJ SOLN
2.0000 mg | INTRAMUSCULAR | Status: DC | PRN
  Filled 2012-03-01 (×2): qty 1

## 2012-03-01 MED ORDER — CHLORHEXIDINE GLUCONATE 4 % EX LIQD
CUTANEOUS | Status: AC
  Filled 2012-03-01: qty 60

## 2012-03-01 MED ORDER — PROPOFOL 10 MG/ML IV EMUL
INTRAVENOUS | Status: DC | PRN
  Administered 2012-03-01: 150 mg via INTRAVENOUS

## 2012-03-01 MED ORDER — FENTANYL CITRATE 0.05 MG/ML IJ SOLN
50.0000 ug | INTRAMUSCULAR | Status: DC | PRN
  Administered 2012-03-01 – 2012-03-02 (×10): 100 ug via INTRAVENOUS
  Administered 2012-03-03: 50 ug via INTRAVENOUS
  Filled 2012-03-01 (×10): qty 2

## 2012-03-01 MED ORDER — OXYCODONE HCL 5 MG PO TABS
5.0000 mg | ORAL_TABLET | ORAL | Status: DC | PRN
  Administered 2012-03-02 (×3): 5 mg via ORAL
  Filled 2012-03-01 (×3): qty 1

## 2012-03-01 MED ORDER — LACTATED RINGERS IV SOLN: 500.0000 mL | Freq: Once | INTRAVENOUS | Status: AC | PRN

## 2012-03-01 MED ORDER — PANTOPRAZOLE SODIUM 40 MG PO TBEC: 40.0000 mg | DELAYED_RELEASE_TABLET | Freq: Every day | ORAL | Status: DC

## 2012-03-01 MED ORDER — GLYCOPYRROLATE 0.2 MG/ML IJ SOLN
INTRAMUSCULAR | Status: DC | PRN
  Administered 2012-03-01: 0.2 mg via INTRAVENOUS

## 2012-03-01 MED ORDER — SODIUM CHLORIDE 0.9 % IV SOLN: 250.0000 mL | INTRAVENOUS | Status: DC

## 2012-03-01 MED ORDER — DEXMEDETOMIDINE HCL IN NACL 200 MCG/50ML IV SOLN
0.1000 ug/kg/h | INTRAVENOUS | Status: DC
  Administered 2012-03-01: 0.7 ug/kg/h via INTRAVENOUS
  Filled 2012-03-01 (×2): qty 50

## 2012-03-01 MED ORDER — SODIUM CHLORIDE 0.9 % IJ SOLN: 3.0000 mL | INTRAMUSCULAR | Status: DC | PRN

## 2012-03-01 MED ORDER — MORPHINE SULFATE 2 MG/ML IJ SOLN
1.0000 mg | INTRAMUSCULAR | Status: DC | PRN
Start: 2012-03-01 — End: 2012-03-01
  Administered 2012-03-01 (×2): 2 mg via INTRAVENOUS

## 2012-03-01 MED ORDER — METOPROLOL TARTRATE 12.5 MG HALF TABLET
12.5000 mg | ORAL_TABLET | Freq: Two times a day (BID) | ORAL | Status: DC
  Filled 2012-03-01: qty 1

## 2012-03-01 MED ORDER — LACTATED RINGERS IV SOLN
INTRAVENOUS | Status: DC | PRN
  Administered 2012-03-01: 08:00:00 via INTRAVENOUS

## 2012-03-01 MED ORDER — BISACODYL 5 MG PO TBEC
10.0000 mg | DELAYED_RELEASE_TABLET | Freq: Every day | ORAL | Status: DC
  Administered 2012-03-02: 10 mg via ORAL
  Filled 2012-03-01: qty 2

## 2012-03-01 MED ORDER — MAGNESIUM SULFATE 40 MG/ML IJ SOLN
4.0000 g | Freq: Once | INTRAMUSCULAR | Status: AC
  Administered 2012-03-01: 4 g via INTRAVENOUS
  Filled 2012-03-01: qty 100

## 2012-03-01 MED ORDER — SODIUM CHLORIDE 0.9 % IV SOLN
INTRAVENOUS | Status: DC
  Administered 2012-03-01: 1.5 [IU]/h via INTRAVENOUS
  Filled 2012-03-01: qty 1

## 2012-03-01 MED ORDER — ASPIRIN EC 325 MG PO TBEC
325.0000 mg | DELAYED_RELEASE_TABLET | Freq: Every day | ORAL | Status: DC
  Administered 2012-03-02: 325 mg via ORAL
  Filled 2012-03-01 (×2): qty 1

## 2012-03-01 MED ORDER — AMIODARONE HCL IN DEXTROSE 360-4.14 MG/200ML-% IV SOLN
60.0000 mg/h | INTRAVENOUS | Status: DC
  Filled 2012-03-01 (×4): qty 200

## 2012-03-01 MED ORDER — AMIODARONE IV BOLUS ONLY 150 MG/100ML
INTRAVENOUS | Status: DC | PRN
  Administered 2012-03-01: 150 mg via INTRAVENOUS

## 2012-03-01 MED ORDER — ALBUMIN HUMAN 5 % IV SOLN
INTRAVENOUS | Status: DC | PRN
  Administered 2012-03-01 (×2): via INTRAVENOUS

## 2012-03-01 MED ORDER — DOCUSATE SODIUM 100 MG PO CAPS
200.0000 mg | ORAL_CAPSULE | Freq: Every day | ORAL | Status: DC
  Administered 2012-03-02: 200 mg via ORAL
  Filled 2012-03-01: qty 2

## 2012-03-01 MED ORDER — LACTATED RINGERS IV SOLN
INTRAVENOUS | Status: DC
  Administered 2012-03-01 (×2): 20 mL/h via INTRAVENOUS

## 2012-03-01 MED ORDER — VANCOMYCIN HCL IN DEXTROSE 1-5 GM/200ML-% IV SOLN
1000.0000 mg | Freq: Once | INTRAVENOUS | Status: AC
  Administered 2012-03-01: 1000 mg via INTRAVENOUS
  Filled 2012-03-01 (×2): qty 200

## 2012-03-01 MED ORDER — POTASSIUM CHLORIDE 10 MEQ/50ML IV SOLN
10.0000 meq | INTRAVENOUS | Status: AC
Start: 2012-03-01 — End: 2012-03-01
  Administered 2012-03-01 (×3): 10 meq via INTRAVENOUS

## 2012-03-01 MED ORDER — SUCCINYLCHOLINE CHLORIDE 20 MG/ML IJ SOLN
INTRAMUSCULAR | Status: DC | PRN
  Administered 2012-03-01: 160 mg via INTRAVENOUS

## 2012-03-01 MED ORDER — ARTIFICIAL TEARS OP OINT
TOPICAL_OINTMENT | OPHTHALMIC | Status: DC | PRN
  Administered 2012-03-01: 1 via OPHTHALMIC

## 2012-03-01 MED ORDER — AMIODARONE HCL IN DEXTROSE 360-4.14 MG/200ML-% IV SOLN
INTRAVENOUS | Status: DC | PRN
  Administered 2012-03-01: 1 mg/min via INTRAVENOUS

## 2012-03-01 MED ORDER — METOPROLOL TARTRATE 25 MG/10 ML ORAL SUSPENSION
12.5000 mg | Freq: Two times a day (BID) | ORAL | Status: DC
  Filled 2012-03-01 (×3): qty 5

## 2012-03-01 MED ORDER — HEMOSTATIC AGENTS (NO CHARGE) OPTIME
TOPICAL | Status: DC | PRN
  Administered 2012-03-01: 1 via TOPICAL

## 2012-03-01 MED ORDER — PROTAMINE SULFATE 10 MG/ML IV SOLN
INTRAVENOUS | Status: DC | PRN
  Administered 2012-03-01: 170 mg via INTRAVENOUS

## 2012-03-01 MED ORDER — ALBUMIN HUMAN 5 % IV SOLN
250.0000 mL | INTRAVENOUS | Status: AC | PRN
  Administered 2012-03-01: 250 mL via INTRAVENOUS

## 2012-03-01 MED ORDER — 0.9 % SODIUM CHLORIDE (POUR BTL) OPTIME
TOPICAL | Status: DC | PRN
  Administered 2012-03-01: 6000 mL

## 2012-03-01 MED ORDER — PHENYLEPHRINE HCL 10 MG/ML IJ SOLN
0.0000 ug/min | INTRAVENOUS | Status: DC
  Administered 2012-03-01: 10 ug/min via INTRAVENOUS
  Filled 2012-03-01: qty 2

## 2012-03-01 MED ORDER — INSULIN REGULAR BOLUS VIA INFUSION
0.0000 [IU] | Freq: Three times a day (TID) | INTRAVENOUS | Status: DC
  Filled 2012-03-01: qty 10

## 2012-03-01 MED ORDER — ACETAMINOPHEN 160 MG/5ML PO SOLN: 975.0000 mg | Freq: Four times a day (QID) | ORAL | Status: DC

## 2012-03-01 MED ORDER — BISACODYL 10 MG RE SUPP: 10.0000 mg | Freq: Every day | RECTAL | Status: DC

## 2012-03-01 MED ORDER — ACETAMINOPHEN 650 MG RE SUPP
650.0000 mg | RECTAL | Status: AC
  Administered 2012-03-01: 650 mg via RECTAL

## 2012-03-01 MED ORDER — DEXTROSE 5 % IV SOLN
1.5000 g | Freq: Two times a day (BID) | INTRAVENOUS | Status: DC
  Administered 2012-03-02 (×3): 1.5 g via INTRAVENOUS
  Filled 2012-03-01 (×5): qty 1.5

## 2012-03-01 MED ORDER — ACETAMINOPHEN 160 MG/5ML PO SOLN
650.0000 mg | ORAL | Status: AC
Start: 2012-03-01 — End: 2012-03-01

## 2012-03-01 MED ORDER — NITROGLYCERIN IN D5W 200-5 MCG/ML-% IV SOLN
0.0000 ug/min | INTRAVENOUS | Status: DC
  Administered 2012-03-01: 10 ug/min via INTRAVENOUS
  Administered 2012-03-01: 30 ug/min via INTRAVENOUS
  Filled 2012-03-01: qty 250

## 2012-03-01 MED ORDER — FENTANYL CITRATE 0.05 MG/ML IJ SOLN
INTRAMUSCULAR | Status: AC
  Administered 2012-03-01: 100 ug via INTRAVENOUS
  Filled 2012-03-01: qty 2

## 2012-03-01 MED ORDER — LACTATED RINGERS IV SOLN
INTRAVENOUS | Status: DC | PRN
  Administered 2012-03-01 (×2): via INTRAVENOUS

## 2012-03-01 MED ORDER — VECURONIUM BROMIDE 10 MG IV SOLR
INTRAVENOUS | Status: DC | PRN
  Administered 2012-03-01: 4 mg via INTRAVENOUS
  Administered 2012-03-01: 6 mg via INTRAVENOUS
  Administered 2012-03-01: 3 mg via INTRAVENOUS
  Administered 2012-03-01: 7 mg via INTRAVENOUS
  Administered 2012-03-01: 5 mg via INTRAVENOUS
  Administered 2012-03-01: 2 mg via INTRAVENOUS

## 2012-03-01 MED ORDER — METOPROLOL TARTRATE 1 MG/ML IV SOLN: 2.5000 mg | INTRAVENOUS | Status: DC | PRN

## 2012-03-01 MED ORDER — MIDAZOLAM HCL 2 MG/2ML IJ SOLN: 2.0000 mg | INTRAMUSCULAR | Status: DC | PRN

## 2012-03-01 MED ORDER — FENTANYL CITRATE 0.05 MG/ML IJ SOLN
INTRAMUSCULAR | Status: DC | PRN
  Administered 2012-03-01 (×2): 250 ug via INTRAVENOUS
  Administered 2012-03-01 (×2): 50 ug via INTRAVENOUS
  Administered 2012-03-01 (×3): 100 ug via INTRAVENOUS
  Administered 2012-03-01: 500 ug via INTRAVENOUS
  Administered 2012-03-01: 50 ug via INTRAVENOUS
  Administered 2012-03-01: 250 ug via INTRAVENOUS
  Administered 2012-03-01: 50 ug via INTRAVENOUS

## 2012-03-01 MED ORDER — GELATIN ABSORBABLE MT POWD
OROMUCOSAL | Status: DC | PRN
  Administered 2012-03-01 (×3): via TOPICAL

## 2012-03-01 MED ORDER — ONDANSETRON HCL 4 MG/2ML IJ SOLN
4.0000 mg | Freq: Four times a day (QID) | INTRAMUSCULAR | Status: DC | PRN
  Administered 2012-03-01 – 2012-03-02 (×3): 4 mg via INTRAVENOUS
  Filled 2012-03-01 (×3): qty 2

## 2012-03-01 MED ORDER — HEPARIN SODIUM (PORCINE) 1000 UNIT/ML IJ SOLN
INTRAMUSCULAR | Status: DC | PRN
  Administered 2012-03-01: 29000 [IU] via INTRAVENOUS

## 2012-03-01 MED ORDER — ASPIRIN 81 MG PO CHEW: 324.0000 mg | CHEWABLE_TABLET | Freq: Every day | ORAL | Status: DC

## 2012-03-01 MED ORDER — MIDAZOLAM HCL 5 MG/5ML IJ SOLN
INTRAMUSCULAR | Status: DC | PRN
  Administered 2012-03-01 (×2): 2 mg via INTRAVENOUS
  Administered 2012-03-01: 3 mg via INTRAVENOUS
  Administered 2012-03-01: 2 mg via INTRAVENOUS
  Administered 2012-03-01: 3 mg via INTRAVENOUS
  Administered 2012-03-01: 1 mg via INTRAVENOUS
  Administered 2012-03-01: 2 mg via INTRAVENOUS

## 2012-03-01 SURGICAL SUPPLY — 114 items
ATTRACTOMAT 16X20 MAGNETIC DRP (DRAPES) ×2 IMPLANT
BAG DECANTER FOR FLEXI CONT (MISCELLANEOUS) ×2 IMPLANT
BANDAGE ELASTIC 4 VELCRO ST LF (GAUZE/BANDAGES/DRESSINGS) ×2 IMPLANT
BANDAGE ELASTIC 6 VELCRO ST LF (GAUZE/BANDAGES/DRESSINGS) ×2 IMPLANT
BANDAGE GAUZE ELAST BULKY 4 IN (GAUZE/BANDAGES/DRESSINGS) ×2 IMPLANT
BLADE STERNUM SYSTEM 6 (BLADE) ×2 IMPLANT
BLADE SURG ROTATE 9660 (MISCELLANEOUS) ×2 IMPLANT
CANISTER SUCTION 2500CC (MISCELLANEOUS) ×2 IMPLANT
CANN PRFSN .5XCNCT 15X34-48 (MISCELLANEOUS) ×1
CANNULA AORTIC HI-FLOW 6.5M20F (CANNULA) ×2 IMPLANT
CANNULA PRFSN .5XCNCT 15X34-48 (MISCELLANEOUS) ×1 IMPLANT
CANNULA VEN 2 STAGE (MISCELLANEOUS) ×1
CATH CPB KIT GERHARDT (MISCELLANEOUS) ×2 IMPLANT
CATH THORACIC 28FR (CATHETERS) ×2 IMPLANT
CATH THORACIC 36FR (CATHETERS) IMPLANT
CATH THORACIC 36FR RT ANG (CATHETERS) IMPLANT
CLIP FOGARTY SPRING 6M (CLIP) ×2 IMPLANT
CLIP RETRACTION 3.0MM CORONARY (MISCELLANEOUS) ×2 IMPLANT
CLIP TI MEDIUM 24 (CLIP) IMPLANT
CLIP TI WIDE RED SMALL 24 (CLIP) IMPLANT
CLOTH BEACON ORANGE TIMEOUT ST (SAFETY) ×2 IMPLANT
COVER SURGICAL LIGHT HANDLE (MISCELLANEOUS) ×4 IMPLANT
CRADLE DONUT ADULT HEAD (MISCELLANEOUS) ×2 IMPLANT
DRAIN CHANNEL 28F RND 3/8 FF (WOUND CARE) ×2 IMPLANT
DRAIN CHANNEL 32F RND 10.7 FF (WOUND CARE) IMPLANT
DRAPE CARDIOVASCULAR INCISE (DRAPES) ×1
DRAPE SLUSH MACHINE 52X66 (DRAPES) IMPLANT
DRAPE SLUSH/WARMER DISC (DRAPES) ×2 IMPLANT
DRAPE SRG 135X102X78XABS (DRAPES) ×1 IMPLANT
DRSG COVADERM 4X14 (GAUZE/BANDAGES/DRESSINGS) ×2 IMPLANT
ELECT BLADE 4.0 EZ CLEAN MEGAD (MISCELLANEOUS) ×2
ELECT CAUTERY BLADE 6.4 (BLADE) ×2 IMPLANT
ELECT REM PT RETURN 9FT ADLT (ELECTROSURGICAL) ×4
ELECTRODE BLDE 4.0 EZ CLN MEGD (MISCELLANEOUS) ×1 IMPLANT
ELECTRODE REM PT RTRN 9FT ADLT (ELECTROSURGICAL) ×2 IMPLANT
GLOVE BIO SURGEON STRL SZ 6 (GLOVE) ×12 IMPLANT
GLOVE BIO SURGEON STRL SZ 6.5 (GLOVE) ×10 IMPLANT
GLOVE BIO SURGEON STRL SZ7 (GLOVE) IMPLANT
GLOVE BIO SURGEON STRL SZ7.5 (GLOVE) IMPLANT
GLOVE BIO SURGEON STRL SZ8 (GLOVE) ×2 IMPLANT
GLOVE BIOGEL PI IND STRL 6 (GLOVE) IMPLANT
GLOVE BIOGEL PI IND STRL 6.5 (GLOVE) ×4 IMPLANT
GLOVE BIOGEL PI IND STRL 7.0 (GLOVE) IMPLANT
GLOVE BIOGEL PI INDICATOR 6 (GLOVE)
GLOVE BIOGEL PI INDICATOR 6.5 (GLOVE) ×4
GLOVE BIOGEL PI INDICATOR 7.0 (GLOVE)
GLOVE EUDERMIC 7 POWDERFREE (GLOVE) IMPLANT
GLOVE ORTHO TXT STRL SZ7.5 (GLOVE) IMPLANT
GOWN STRL NON-REIN LRG LVL3 (GOWN DISPOSABLE) ×18 IMPLANT
HEMOSTAT POWDER SURGIFOAM 1G (HEMOSTASIS) ×6 IMPLANT
HEMOSTAT SURGICEL 2X14 (HEMOSTASIS) ×2 IMPLANT
INSERT FOGARTY 61MM (MISCELLANEOUS) IMPLANT
INSERT FOGARTY XLG (MISCELLANEOUS) IMPLANT
KIT BASIN OR (CUSTOM PROCEDURE TRAY) ×2 IMPLANT
KIT ROOM TURNOVER OR (KITS) ×2 IMPLANT
KIT SUCTION CATH 14FR (SUCTIONS) ×4 IMPLANT
KIT VASOVIEW W/TROCAR VH 2000 (KITS) ×2 IMPLANT
LEAD PACING MYOCARDI (MISCELLANEOUS) ×2 IMPLANT
MARKER GRAFT CORONARY BYPASS (MISCELLANEOUS) ×6 IMPLANT
NS IRRIG 1000ML POUR BTL (IV SOLUTION) ×12 IMPLANT
PACK OPEN HEART (CUSTOM PROCEDURE TRAY) ×2 IMPLANT
PAD ARMBOARD 7.5X6 YLW CONV (MISCELLANEOUS) ×4 IMPLANT
PENCIL BUTTON HOLSTER BLD 10FT (ELECTRODE) ×2 IMPLANT
PUNCH AORTIC ROTATE 4.0MM (MISCELLANEOUS) ×2 IMPLANT
PUNCH AORTIC ROTATE 4.5MM 8IN (MISCELLANEOUS) IMPLANT
PUNCH AORTIC ROTATE 5MM 8IN (MISCELLANEOUS) IMPLANT
SET CARDIOPLEGIA MPS 5001102 (MISCELLANEOUS) ×2 IMPLANT
SOLUTION ANTI FOG 6CC (MISCELLANEOUS) IMPLANT
SPONGE GAUZE 4X4 12PLY (GAUZE/BANDAGES/DRESSINGS) ×4 IMPLANT
SPONGE LAP 18X18 X RAY DECT (DISPOSABLE) ×4 IMPLANT
SPONGE LAP 4X18 X RAY DECT (DISPOSABLE) IMPLANT
SUT BONE WAX W31G (SUTURE) ×2 IMPLANT
SUT MNCRL AB 4-0 PS2 18 (SUTURE) ×2 IMPLANT
SUT PROLENE 3 0 SH DA (SUTURE) IMPLANT
SUT PROLENE 3 0 SH1 36 (SUTURE) ×2 IMPLANT
SUT PROLENE 4 0 RB 1 (SUTURE)
SUT PROLENE 4 0 SH DA (SUTURE) IMPLANT
SUT PROLENE 4 0 TF (SUTURE) ×4 IMPLANT
SUT PROLENE 4-0 RB1 .5 CRCL 36 (SUTURE) IMPLANT
SUT PROLENE 5 0 C 1 36 (SUTURE) IMPLANT
SUT PROLENE 5 0 C1 (SUTURE) ×2 IMPLANT
SUT PROLENE 6 0 C 1 30 (SUTURE) ×4 IMPLANT
SUT PROLENE 6 0 CC (SUTURE) ×6 IMPLANT
SUT PROLENE 7 0 BV 1 (SUTURE) IMPLANT
SUT PROLENE 7 0 BV1 MDA (SUTURE) ×6 IMPLANT
SUT PROLENE 7.0 RB 3 (SUTURE) IMPLANT
SUT PROLENE 8 0 BV175 6 (SUTURE) ×16 IMPLANT
SUT SILK  1 MH (SUTURE)
SUT SILK 1 MH (SUTURE) IMPLANT
SUT SILK 2 0 SH CR/8 (SUTURE) IMPLANT
SUT SILK 3 0 SH CR/8 (SUTURE) IMPLANT
SUT STEEL 6MS V (SUTURE) ×2 IMPLANT
SUT STEEL STERNAL CCS#1 18IN (SUTURE) IMPLANT
SUT STEEL SZ 6 DBL 3X14 BALL (SUTURE) ×2 IMPLANT
SUT VIC AB 1 CTX 18 (SUTURE) ×4 IMPLANT
SUT VIC AB 1 CTX 36 (SUTURE)
SUT VIC AB 1 CTX36XBRD ANBCTR (SUTURE) IMPLANT
SUT VIC AB 2-0 CT1 27 (SUTURE) ×1
SUT VIC AB 2-0 CT1 TAPERPNT 27 (SUTURE) ×1 IMPLANT
SUT VIC AB 2-0 CTX 27 (SUTURE) IMPLANT
SUT VIC AB 3-0 SH 27 (SUTURE)
SUT VIC AB 3-0 SH 27X BRD (SUTURE) IMPLANT
SUT VIC AB 3-0 X1 27 (SUTURE) IMPLANT
SUT VICRYL 4-0 PS2 18IN ABS (SUTURE) IMPLANT
SUTURE E-PAK OPEN HEART (SUTURE) ×2 IMPLANT
SYSTEM SAHARA CHEST DRAIN ATS (WOUND CARE) ×2 IMPLANT
TOWEL OR 17X24 6PK STRL BLUE (TOWEL DISPOSABLE) ×4 IMPLANT
TOWEL OR 17X26 10 PK STRL BLUE (TOWEL DISPOSABLE) ×4 IMPLANT
TRAY FOLEY IC TEMP SENS 14FR (CATHETERS) ×2 IMPLANT
TUBE FEEDING 8FR 16IN STR KANG (MISCELLANEOUS) ×2 IMPLANT
TUBE SUCT INTRACARD DLP 20F (MISCELLANEOUS) ×2 IMPLANT
TUBING INSUFFLATION 10FT LAP (TUBING) ×2 IMPLANT
UNDERPAD 30X30 INCONTINENT (UNDERPADS AND DIAPERS) ×2 IMPLANT
WATER STERILE IRR 1000ML POUR (IV SOLUTION) ×4 IMPLANT

## 2012-03-01 NOTE — Procedures (Signed)
Extubation Procedure Note  Patient Details:   Name: Mark Moyer DOB: October 11, 1960 MRN: [id number]   Airway Documentation:     Evaluation  O2 sats: stable throughout Complications: No apparent complications Patient did tolerate procedure well. Bilateral Breath Sounds: Rhonchi   Yes  Newt Lukes 03/01/2012, 7:04 PM

## 2012-03-01 NOTE — Brief Op Note (Addendum)
02/27/2012 - 03/01/2012                   [address]            [place] [address]          [phone]     1:12 PM  PATIENT:  Mark Moyer  51 y.o. male  PRE-OPERATIVE DIAGNOSIS:  CAD  POST-OPERATIVE DIAGNOSIS:  coronary Artery Disease  PROCEDURE:  Procedure(s) (LRB):  CORONARY ARTERY BYPASS GRAFTING x7  LIMA TO LAD  SEQUENTIAL SVG TO DX AND RAMUS INTERMEDIATE SEQUENTIAL SVG TO DISTAL L CX AND PLB SEQUENTIAL SVG TO ACUTE MARGINAL AND PDA  ENDOSCOPIC SAPHENOUS VEIN HARVEST RIGHT LEG  TRANSESOPHAGEAL ECHOCARDIOGRAM (TEE) (N/A)  SURGEON:  Surgeon(s) and Role:    * Delight Ovens, MD - Primary  PHYSICIAN ASSISTANT: Erin Barrett PA-C  ANESTHESIA:   general  EBL:  Total I/O In: 1000 [I.V.:1000] Out: 305 [Urine:305]  BLOOD ADMINISTERED: CC CELLSAVER  DRAINS: Left plerual chest tube, mediastinal chest drains   LOCAL MEDICATIONS USED:  NONE  SPECIMEN:  No Specimen  DISPOSITION OF SPECIMEN:  N/A  COUNTS:  YES   DICTATION: .Dragon Dictation  PLAN OF CARE: Admit to inpatient   PATIENT DISPOSITION:  ICU - intubated and hemodynamically stable.   Delay start of Pharmacological VTE agent (>24hrs) due to surgical blood loss or risk of bleeding: yes

## 2012-03-01 NOTE — Progress Notes (Signed)
*  PRELIMINARY RESULTS* Echocardiogram Echocardiogram Transesophageal has been performed.  Mark Moyer 03/01/2012, 11:08 AM

## 2012-03-01 NOTE — Anesthesia Preprocedure Evaluation (Addendum)
Anesthesia Evaluation  Patient identified by MRN, date of birth, ID band Patient awake    Reviewed: Allergy & Precautions, H&P , NPO status , Patient's Chart, lab work & pertinent test results  Airway Mallampati: II TM Distance: >3 FB Neck ROM: Full    Dental  (+) Teeth Intact   Pulmonary          Cardiovascular + Past MI + dysrhythmias Atrial Fibrillation Rhythm:Regular Rate:Bradycardia     Neuro/Psych    GI/Hepatic negative GI ROS, Neg liver ROS,   Endo/Other  negative endocrine ROS  Renal/GU negative Renal ROS     Musculoskeletal   Abdominal (+)  Abdomen: soft.    Peds  Hematology   Anesthesia Other Findings   Reproductive/Obstetrics                          Anesthesia Physical Anesthesia Plan  ASA: III  Anesthesia Plan: General   Post-op Pain Management:    Induction: Intravenous  Airway Management Planned: Oral ETT  Additional Equipment: Arterial line, PA Cath, TEE and Ultrasound Guidance Line Placement  Intra-op Plan:   Post-operative Plan: Post-operative intubation/ventilation  Informed Consent: I have reviewed the patients History and Physical, chart, labs and discussed the procedure including the risks, benefits and alternatives for the proposed anesthesia with the patient or authorized representative who has indicated his/her understanding and acceptance.   Dental advisory given  Plan Discussed with: CRNA, Anesthesiologist and Surgeon  Anesthesia Plan Comments:         Anesthesia Quick Evaluation

## 2012-03-01 NOTE — Progress Notes (Signed)
Patient ID: Mark Moyer, male   DOB: 08-29-1960, 51 y.o.   MRN: [id number]                   [address]            [place] [address]          [phone]     Day of Surgery Procedure(s) (LRB): CORONARY ARTERY BYPASS GRAFTING (CABG) (N/A) TRANSESOPHAGEAL ECHOCARDIOGRAM (TEE) (N/A)  Total Length of Stay:  LOS: 3 days  BP 120/83  Pulse 80  Temp 98.1 F (36.7 C) (Core (Comment))  Resp 15  Ht 6' (1.829 m)  Wt 187 lb 9.8 oz (85.1 kg)  BMI 25.44 kg/m2  SpO2 100%     . sodium chloride 20 mL/hr (03/01/12 1530)  . sodium chloride 20 mL/hr (03/01/12 1530)  . sodium chloride    . dexmedetomidine 0.7 mcg/kg/hr (03/01/12 1530)  . insulin (NOVOLIN-R) infusion 1.5 Units/hr (03/01/12 1530)  . lactated ringers 20 mL/hr (03/01/12 1607)  . nitroGLYCERIN 30 mcg/min (03/01/12 1606)  . phenylephrine (NEO-SYNEPHRINE) Adult infusion 10 mcg/min (03/01/12 1530)  . DISCONTD: sodium chloride 250 mL (03/01/12 0400)  . DISCONTD: amiodarone (NEXTERONE PREMIX) 360 mg/200 mL dextrose    . DISCONTD: eptifibatide Stopped (03/01/12 0300)  . DISCONTD: heparin 2,000 Units/hr (03/01/12 0400)  . DISCONTD: nitroGLYCERIN Stopped (03/01/12 1344)     Lab Results  Component Value Date   WBC 13.8* 03/01/2012   HGB 10.9* 03/01/2012   HCT 32.0* 03/01/2012   PLT 121* 03/01/2012   GLUCOSE 111* 03/01/2012   CHOL 259* 02/28/2012   TRIG 194* 02/28/2012   HDL 43 02/28/2012   LDLCALC 177* 02/28/2012   ALT 12 02/29/2012   AST 46* 02/29/2012   NA 141 03/01/2012   K 3.6 03/01/2012   CL 103 03/01/2012   CREATININE 0.80 03/01/2012   BUN 11 03/01/2012   CO2 26 03/01/2012   TSH 4.061 02/28/2012   INR 1.37 03/01/2012   HGBA1C 5.6 02/29/2012   Extubated, not bleeding stable  Delight Ovens MD  Beeper [phone] Office [phone] 03/01/2012 8:04 PM

## 2012-03-01 NOTE — Preoperative (Signed)
Beta Blockers   Reason not to administer Beta Blockers:Hold  beta blocker due to Bradycardia (HR less than 50 bpm) 

## 2012-03-01 NOTE — Anesthesia Procedure Notes (Signed)
Procedures Procedures: Right IJ Theone Murdoch Catheter Insertion:[id number] The patient was identified and consent obtained.  TO was performed, and full barrier precautions were used.  The skin was anesthetized with lidocaine-4cc plain with 25g needle.  Once the vein was located with the 22 ga. needle using ultrasound guidance , the wire was inserted into the vein.  The wire location was confirmed with ultrasound.  The tissue was dilated and the 8.5 [place] cordis catheter was carefully inserted. Afterwards Theone Murdoch catheter was inserted. PA catheter at 49cm.  The patient tolerated the procedure well.  CE

## 2012-03-01 NOTE — Anesthesia Postprocedure Evaluation (Signed)
  Anesthesia Post-op Note  Patient: Mark Moyer  Procedure(s) Performed: Procedure(s) (LRB): CORONARY ARTERY BYPASS GRAFTING (CABG) (N/A) TRANSESOPHAGEAL ECHOCARDIOGRAM (TEE) (N/A)  Patient Location: PACU  Anesthesia Type: General  Level of Consciousness: awake  Airway and Oxygen Therapy: Patient Spontanous Breathing  Post-op Pain: mild  Post-op Assessment: Post-op Vital signs reviewed  Post-op Vital Signs: Reviewed  Complications: No apparent anesthesia complications 

## 2012-03-01 NOTE — Anesthesia Postprocedure Evaluation (Signed)
  Anesthesia Post-op Note  Patient: Mark Moyer  Procedure(s) Performed: Procedure(s) (LRB): CORONARY ARTERY BYPASS GRAFTING (CABG) (N/A) TRANSESOPHAGEAL ECHOCARDIOGRAM (TEE) (N/A)  Patient Location: PACU  Anesthesia Type: General  Level of Consciousness: awake  Airway and Oxygen Therapy: Patient Spontanous Breathing  Post-op Pain: mild  Post-op Assessment: Post-op Vital signs reviewed  Post-op Vital Signs: Reviewed  Complications: No apparent anesthesia complications

## 2012-03-01 NOTE — Transfer of Care (Signed)
Immediate Anesthesia Transfer of Care Note  Patient: Mark Moyer  Procedure(s) Performed: Procedure(s) (LRB): CORONARY ARTERY BYPASS GRAFTING (CABG) (N/A) TRANSESOPHAGEAL ECHOCARDIOGRAM (TEE) (N/A)  Patient Location: SICU  Anesthesia Type: General  Level of Consciousness: sedated and Patient remains intubated per anesthesia plan  Airway & Oxygen Therapy: Patient remains intubated per anesthesia plan and Patient placed on Ventilator (see vital sign flow sheet for setting)  Post-op Assessment: Report given to PACU RN and Post -op Vital signs reviewed and stable  Post vital signs: Reviewed and stable  Complications: No apparent anesthesia complications

## 2012-03-02 ENCOUNTER — Inpatient Hospital Stay (HOSPITAL_COMMUNITY): Payer: MEDICAID

## 2012-03-02 ENCOUNTER — Encounter (HOSPITAL_COMMUNITY): Payer: Self-pay | Admitting: Cardiology

## 2012-03-02 DIAGNOSIS — I2510 Atherosclerotic heart disease of native coronary artery without angina pectoris: Secondary | ICD-10-CM | POA: Diagnosis not present

## 2012-03-02 DIAGNOSIS — Z8249 Family history of ischemic heart disease and other diseases of the circulatory system: Secondary | ICD-10-CM

## 2012-03-02 LAB — GLUCOSE, CAPILLARY
Glucose-Capillary: 106 mg/dL — ABNORMAL HIGH (ref 70–99)
Glucose-Capillary: 118 mg/dL — ABNORMAL HIGH (ref 70–99)
Glucose-Capillary: 121 mg/dL — ABNORMAL HIGH (ref 70–99)
Glucose-Capillary: 109 mg/dL — ABNORMAL HIGH (ref 70–99)

## 2012-03-02 LAB — CBC
HCT: 28.1 % — ABNORMAL LOW (ref 39.0–52.0)
Hemoglobin: 9.8 g/dL — ABNORMAL LOW (ref 13.0–17.0)
Hemoglobin: 9.9 g/dL — ABNORMAL LOW (ref 13.0–17.0)
MCH: 31.9 pg (ref 26.0–34.0)
MCHC: 35.9 g/dL (ref 30.0–36.0)
MCV: 90.1 fL (ref 78.0–100.0)
RDW: 13 % (ref 11.5–15.5)
RDW: 13 % (ref 11.5–15.5)
WBC: 15.3 10*3/uL — ABNORMAL HIGH (ref 4.0–10.5)

## 2012-03-02 LAB — BASIC METABOLIC PANEL
Calcium: 8.3 mg/dL — ABNORMAL LOW (ref 8.4–10.5)
GFR calc Af Amer: >90 mL/min (ref >90)
GFR calc non Af Amer: >90 mL/min (ref >90)
Glucose, Bld: 102 mg/dL — ABNORMAL HIGH (ref 70–99)
Sodium: 138 mEq/L (ref 135–145)

## 2012-03-02 LAB — CREATININE, SERUM: GFR calc Af Amer: >90 mL/min (ref >90)

## 2012-03-02 MED ORDER — INSULIN ASPART 100 UNIT/ML ~~LOC~~ SOLN: 0.0000 [IU] | SUBCUTANEOUS | Status: DC

## 2012-03-02 MED ORDER — CARVEDILOL 3.125 MG PO TABS
3.1250 mg | ORAL_TABLET | Freq: Two times a day (BID) | ORAL | Status: DC
  Administered 2012-03-02 – 2012-03-04 (×4): 3.125 mg via ORAL
  Filled 2012-03-02 (×6): qty 1

## 2012-03-02 MED ORDER — KETOROLAC TROMETHAMINE 15 MG/ML IJ SOLN
15.0000 mg | Freq: Three times a day (TID) | INTRAMUSCULAR | Status: DC
  Administered 2012-03-02 – 2012-03-03 (×3): 15 mg via INTRAVENOUS
  Filled 2012-03-02 (×6): qty 1

## 2012-03-02 MED ORDER — INSULIN ASPART 100 UNIT/ML ~~LOC~~ SOLN: 0.0000 [IU] | Freq: Three times a day (TID) | SUBCUTANEOUS | Status: DC

## 2012-03-02 MED ORDER — INSULIN ASPART 100 UNIT/ML ~~LOC~~ SOLN: 0.0000 [IU] | SUBCUTANEOUS | Status: AC

## 2012-03-02 MED ORDER — ENOXAPARIN SODIUM 40 MG/0.4ML ~~LOC~~ SOLN
40.0000 mg | SUBCUTANEOUS | Status: DC
  Administered 2012-03-02 – 2012-03-05 (×4): 40 mg via SUBCUTANEOUS
  Filled 2012-03-02 (×5): qty 0.4

## 2012-03-02 MED FILL — Potassium Chloride Inj 2 mEq/ML: INTRAVENOUS | Qty: 40 | Status: AC

## 2012-03-02 MED FILL — Magnesium Sulfate Inj 50%: INTRAMUSCULAR | Qty: 2 | Status: AC

## 2012-03-02 NOTE — Progress Notes (Addendum)
Subjective:  Awake, extubated  Objective:  Vital Signs in the last 24 hours: Temp:  [95.9 F (35.5 C)-99.1 F (37.3 C)] 99 F (37.2 C) (08/15 0800) Pulse Rate:  [70-90] 70  (08/15 0800) Resp:  [8-25] 21  (08/15 0800) BP: (93-146)/(58-99) 141/82 mmHg (08/15 0800) SpO2:  [95 %-100 %] 100 % (08/15 0800) Arterial Line BP: (93-194)/(53-96) 191/88 mmHg (08/15 0800) FiO2 (%):  [4 %-50 %] 4 % (08/15 0000) Weight:  [85.5 kg (188 lb 7.9 oz)] 85.5 kg (188 lb 7.9 oz) (08/15 0400)  Intake/Output from previous day:  Intake/Output Summary (Last 24 hours) at 03/02/12 0932 Last data filed at 03/02/12 0800  Gross per 24 hour  Intake 3799.2 ml  Output   4275 ml  Net -475.8 ml    Physical Exam: General appearance: alert, cooperative and no distress Lungs: decreased breath sounds Heart: regular rate and rhythm, positive rub   Rate: 50-70  Rhythm: sinus bradycardia and pacing at 70 earlier, now NSR 71  Lab Results:  Basename 03/02/12 0400 03/01/12 2135  WBC 11.8* 13.6*  HGB 9.8* 10.3*  PLT 160 151    Basename 03/02/12 0400 03/01/12 2135 03/01/12 2121 03/01/12 0555  NA 138 -- 140 --  K 4.0 -- 4.1 --  CL 105 -- 105 --  CO2 24 -- -- 26  GLUCOSE 102* -- 130* --  BUN 10 -- 8 --  CREATININE 0.65 0.67 -- --    Basename 02/28/12 1001  TROPONINI 2.68*   Hepatic Function Panel  Basename 02/29/12 1855  PROT 7.1  ALBUMIN 4.0  AST 46*  ALT 12  ALKPHOS 50  BILITOT 0.9  BILIDIR --  IBILI --   No results found for this basename: CHOL in the last 72 hours  Basename 03/01/12 1500  INR 1.37    Imaging: Imaging results have been reviewed  Cardiac Studies:  Assessment/Plan:   Principal Problem:  *NSTEMI (non-ST elevated myocardial infarction)  Active Problems:  CAD, elective CABG X 7 Aug 14th 2013  Atrial fibrillation with rapid ventricular response - converted spontaneously with resoluition of Chest pain after NTG  Dyslipidemia  EF 45-50%  Bradycardia post op  Plan-  Per CVTS, on low dose Metoprolol, will fiollow   [employer] PA-C 03/02/2012, 9:32 AM   I have seen and examined the patient along with Corine Shelter PA-C. have reviewed the chart, notes and new data.  I agree with PA's note.  Key new complaints: progressing very well, CT already out; just sternotomy pain and nausea Key examination changes: no rub; in NSR Key new findings / data: Hgb just under 10  PLAN: Will follow. May need more beta blocker for HBP, but this is probably pain driven and should improve.  Thurmon Fair, MD, [facility] [phone] 03/02/2012, 12:09 PM  He has had issues with bradycardia with Metoprolol in the past & is reluctant to take this.  I have changed him to Coreg 3.125 mg bid (should have less of a rate lowering effect).  Would also consider ACE-I if  HR does not allow titration of the BB.  Marykay Lex, M.D., M.S. [facility] [address] [place], [place]  [address]  [phone] Pager # [phone]  03/02/2012 2:13 PM

## 2012-03-02 NOTE — Progress Notes (Signed)
Patient ID: Mark Moyer, male   DOB: 1961-04-16, 51 y.o.   MRN: [id number] [facility] DAILY PROGRESS NOTE                   [address]            [place] [address]          [phone]      1 Day Post-Op Procedure(s) (LRB): CORONARY ARTERY BYPASS GRAFTING (CABG) (N/A) TRANSESOPHAGEAL ECHOCARDIOGRAM (TEE) (N/A)  Total Length of Stay:  LOS: 4 days   Subjective: Stable post op extubated, need high doses of pain meds  Objective: Vital signs in last 24 hours: Temp:  [95.9 F (35.5 C)-99.1 F (37.3 C)] 99.1 F (37.3 C) (08/15 0724) Pulse Rate:  [70-90] 70  (08/15 0715) Cardiac Rhythm:  [-] Atrial paced (08/15 0400) Resp:  [8-25] 24  (08/15 0715) BP: (93-146)/(58-99) 134/87 mmHg (08/15 0715) SpO2:  [95 %-100 %] 100 % (08/15 0715) Arterial Line BP: (93-194)/(53-96) 192/88 mmHg (08/15 0715) FiO2 (%):  [4 %-50 %] 4 % (08/15 0000) Weight:  [188 lb 7.9 oz (85.5 kg)] 188 lb 7.9 oz (85.5 kg) (08/15 0400)  Filed Weights   02/29/12 1700 02/29/12 2200 03/02/12 0400  Weight: 187 lb 9.8 oz (85.1 kg) 187 lb 9.8 oz (85.1 kg) 188 lb 7.9 oz (85.5 kg)    Weight change: 14.1 oz (0.4 kg)   Hemodynamic parameters for last 24 hours: PAP: (11-37)/(1-19) 26/11 mmHg CO:  [3.6 L/min-6.1 L/min] 4.3 L/min CI:  [1.7 L/min/m2-3 L/min/m2] 2.1 L/min/m2  Intake/Output from previous day: 08/14 0701 - 08/15 0700 In: 3756.2 [I.V.:2106.2; Blood:500; IV Piggyback:1150] Out: 4275 [Urine:2455; Emesis/NG output:400; Blood:1000; Chest Tube:420]  Intake/Output this shift:    Current Meds: Scheduled Meds:   . acetaminophen (TYLENOL) oral liquid 160 mg/5 mL  650 mg Per Tube NOW   Or  . acetaminophen  650 mg Rectal NOW  . acetaminophen  1,000 mg Oral Q6H   Or  . acetaminophen (TYLENOL) oral liquid 160 mg/5 mL  975 mg Per Tube Q6H  . aspirin EC  325 mg Oral Daily   Or  . aspirin  324 mg Per Tube Daily  . atorvastatin  80 mg Oral q1800  . bisacodyl  10 mg Oral Daily   Or  . bisacodyl  10  mg Rectal Daily  . cefUROXime (ZINACEF)  IV  1.5 g Intravenous To OR  . cefUROXime (ZINACEF)  IV  1.5 g Intravenous Q12H  . chlorhexidine      . dexmedetomidine  0.1-0.7 mcg/kg/hr Intravenous To OR  . docusate sodium  200 mg Oral Daily  . famotidine (PEPCID) IV  20 mg Intravenous Q12H  . insulin aspart  0-24 Units Subcutaneous Q2H   Followed by  . insulin aspart  0-24 Units Subcutaneous Q4H  . insulin (NOVOLIN-R) infusion   Intravenous To OR  . insulin regular  0-10 Units Intravenous TID WC  . magnesium sulfate  4 g Intravenous Once  . metoprolol tartrate  12.5 mg Per Tube BID  . nitroglycerin-nicardipine-HEPARIN-sodium bicarbonate irrigation for artery spasm   Irrigation To OR  . pantoprazole  40 mg Oral Q1200  . phenylephrine (NEO-SYNEPHRINE) Adult infusion  30-200 mcg/min Intravenous To OR  . potassium chloride  10 mEq Intravenous Q1 Hr x 3  . sodium chloride  3 mL Intravenous Q12H  . tranexamic acid  15 mg/kg Intravenous To OR  . tranexamic acid (CYKLOKAPRON) infusion ([facility])  1.5 mg/kg/hr Intravenous To  OR  . vancomycin  1,500 mg Intravenous To OR  . vancomycin  1,000 mg Intravenous Once  . DISCONTD: aspirin  325 mg Oral Daily  . DISCONTD: cefUROXime (ZINACEF)  IV  750 mg Intravenous To OR  . DISCONTD: DOPamine  2-20 mcg/kg/min Intravenous To OR  . DISCONTD: epinephrine  0.5-20 mcg/min Intravenous To OR  . DISCONTD: magnesium sulfate  40 mEq Other To OR  . DISCONTD: metoprolol tartrate  12.5 mg Oral BID  . DISCONTD: metoprolol tartrate  12.5 mg Oral Once  . DISCONTD: metoprolol tartrate  12.5 mg Oral BID  . DISCONTD: nitroGLYCERIN  2-200 mcg/min Intravenous To OR  . DISCONTD: potassium chloride  80 mEq Other To OR  . DISCONTD: sodium chloride  3 mL Intravenous Q12H  . DISCONTD: tranexamic acid  2 mg/kg Intracatheter To OR   Continuous Infusions:   . sodium chloride 20 mL/hr (03/01/12 1530)  . sodium chloride 20 mL/hr (03/01/12 1530)  . sodium chloride    . dexmedetomidine  0.3 mcg/kg/hr (03/01/12 2000)  . insulin (NOVOLIN-R) infusion 1.5 Units/hr (03/01/12 1530)  . lactated ringers 20 mL/hr (03/01/12 1607)  . nitroGLYCERIN Stopped (03/02/12 0500)  . phenylephrine (NEO-SYNEPHRINE) Adult infusion Stopped (03/01/12 1606)  . DISCONTD: sodium chloride 250 mL (03/01/12 0400)  . DISCONTD: amiodarone (NEXTERONE PREMIX) 360 mg/200 mL dextrose    . DISCONTD: eptifibatide Stopped (03/01/12 0300)  . DISCONTD: heparin 2,000 Units/hr (03/01/12 0400)  . DISCONTD: nitroGLYCERIN Stopped (03/01/12 1344)   PRN Meds:.albumin human, albuterol, fentaNYL, lactated ringers, metoprolol, midazolam, ondansetron (ZOFRAN) IV, oxyCODONE, sodium chloride, DISCONTD: 0.9 % irrigation (POUR BTL), DISCONTD: acetaminophen, DISCONTD: guaiFENesin-dextromethorphan, DISCONTD: hemostatic agents, DISCONTD: HYDROcodone-acetaminophen, DISCONTD:  morphine injection, DISCONTD:  morphine injection, DISCONTD:  morphine injection, DISCONTD:  morphine injection DISCONTD: ondansetron (ZOFRAN) IV, DISCONTD: sodium chloride, DISCONTD: Surgifoam 1 Gm with 0.9% sodium chloride (4 ml) topical solution  General appearance: alert, cooperative and mild distress Neurologic: intact Heart: regular rate and rhythm, S1, S2 normal, no murmur, click, rub or gallop and normal apical impulse Lungs: clear to auscultation bilaterally and normal percussion bilaterally Abdomen: soft, non-tender; bowel sounds normal; no masses,  no organomegaly Extremities: extremities normal, atraumatic, no cyanosis or edema and Homans sign is negative, no sign of DVT Wound: sternum stable  Lab Results: CBC: Basename 03/02/12 0400 03/01/12 2135  WBC 11.8* 13.6*  HGB 9.8* 10.3*  HCT 27.3* 29.2*  PLT 160 151   BMET:  Basename 03/02/12 0400 03/01/12 2135 03/01/12 2121 03/01/12 0555  NA 138 -- 140 --  K 4.0 -- 4.1 --  CL 105 -- 105 --  CO2 24 -- -- 26  GLUCOSE 102* -- 130* --  BUN 10 -- 8 --  CREATININE 0.65 0.67 -- --  CALCIUM 8.3* --  -- 9.6    PT/INR:  Basename 03/01/12 1500  LABPROT 17.1*  INR 1.37   Radiology: Dg Chest 2 View  02/29/2012  *RADIOLOGY REPORT*  Clinical Data: Preoperative radiograph.  CABG.  CHEST - 2 VIEW  Comparison: 02/29/2012, 1732 hours.  Findings:  Cardiopericardial silhouette within normal limits. Mediastinal contours normal. Trachea midline.  No airspace disease or effusion. Monitoring leads are projected over the chest.  IMPRESSION: No active cardiopulmonary disease.  Original Report Authenticated By: Andreas Newport, M.D.   Dg Chest Portable 1 View In Am  03/02/2012  *RADIOLOGY REPORT*  Clinical Data: Postop CABG  PORTABLE CHEST - 1 VIEW  Comparison: Portable chest x-ray of 03/01/2012  Findings: The endotracheal tube has been removed.  There  is little change in aeration with only mild basilar volume loss present.  No pneumothorax is seen, and left chest tube remains.  Cardiomegaly stable.  Swan-Ganz catheter tip is unchanged in position.  IMPRESSION: Endotracheal tube removed.  Slightly better aeration with only minimal basilar atelectasis.  Original Report Authenticated By: Juline Patch, M.D.   Dg Chest Portable 1 View  03/01/2012  *RADIOLOGY REPORT*  Clinical Data: Postoperative studies status post CABG.  PORTABLE CHEST - 1 VIEW  Comparison: Chest x-ray 02/29/2012.  Findings: An endotracheal tube is in place with tip 7.4 cm above the carina. Left-sided chest tube in position with tip projecting over the left apex. A nasogastric tube is seen extending into the stomach, however, the tip of the nasogastric tube extends below the lower margin of the image.  Right-sided internal jugular cordis through which a Theone Murdoch catheter has been passed into the proximal right main pulmonary artery.  Lung volumes are low. Minimal bibasilar opacities are consistent with some mild postoperative atelectasis.  Probable small left-sided pleural effusion.  No appreciable pneumothorax.  Pulmonary vasculature is normal.   Cardiomediastinal silhouette is within normal limits. Status post median sternotomy for CABG.  IMPRESSION: 1.  Postoperative changes and support apparatus, as above. 2.  Low lung volumes with minimal bibasilar subsegmental atelectasis and probable small left-sided pleural effusion.  Original Report Authenticated By: Florencia Reasons, M.D.   Dg Chest Port 1 View  02/29/2012  *RADIOLOGY REPORT*  Clinical Data: Return respiratory films.  Chest pain.  PORTABLE CHEST - 1 VIEW  Comparison: None.  Findings: The lungs are clear.  Heart size is normal.  No pneumothorax or pleural fluid.  IMPRESSION: Negative chest.  Original Report Authenticated By: Bernadene Bell. Maricela Curet, M.D.     Assessment/Plan: S/P Procedure(s) (LRB): CORONARY ARTERY BYPASS GRAFTING (CABG) (N/A) TRANSESOPHAGEAL ECHOCARDIOGRAM (TEE) (N/A) Mobilize Diuresis d/c tubes/lines Continue foley due to strict I&O, patient in ICU and urinary output monitoring See progression orders Expected Acute  Blood - loss Anemia Add Toradol for pain    Delight Ovens MD  Beeper [phone] Office [phone] 03/02/2012 8:33 AM

## 2012-03-02 NOTE — Plan of Care (Signed)
Problem: Phase II Progression Outcomes Goal: Patient extubated within - Outcome: Completed/Met Date Met:  03/02/12 6hours

## 2012-03-02 NOTE — Care Management Note (Unsigned)
    Page 1 of 1   03/02/2012     1:18:51 PM   CARE MANAGEMENT NOTE 03/02/2012  Patient:  RODRECUS, BELSKY   Account Number:  [id number]  Date Initiated:  02/28/2012  Documentation initiated by:  Junius Creamer  Subjective/Objective Assessment:   adm w mi     Action/Plan:   lives w wife   Anticipated DC Date:  03/06/2012   Anticipated DC Plan:  HOME W HOME HEALTH SERVICES      DC Planning Services  CM consult      Choice offered to / List presented to:             Status of service:  In process, will continue to follow Medicare Important Message given?   (If response is "NO", the following Medicare IM given date fields will be blank) Date Medicare IM given:   Date Additional Medicare IM given:    Discharge Disposition:    Per UR Regulation:  Reviewed for med. necessity/level of care/duration of stay  If discussed at Long Length of Stay Meetings, dates discussed:    Comments:  03/02/12 Devanta Daniel,RN,BSN 1100 PT S/P CABG X 7 ON 03/02/12.  PTA, PT INDEPENDENT, LIVES WITH SPOUSE.  MET WITH PT TO DISCUSS DC PLANS.  PT STATES WIFE WORKS, UNSURE IF SHE CAN PROVIDE 24HR CARE AT DC, BUT HE  WILL TRY TO COME UP WITH A PLAN FOR 24 HR CARE.  WILL FOLLOW.   8/12 14:30 debbie dowell rn,bsn [phone]

## 2012-03-03 ENCOUNTER — Inpatient Hospital Stay (HOSPITAL_COMMUNITY): Payer: MEDICAID

## 2012-03-03 DIAGNOSIS — D62 Acute posthemorrhagic anemia: Secondary | ICD-10-CM

## 2012-03-03 LAB — BASIC METABOLIC PANEL
BUN: 12 mg/dL (ref 6–23)
CO2: 27 mEq/L (ref 19–32)
Calcium: 8.5 mg/dL (ref 8.4–10.5)
Chloride: 105 mEq/L (ref 96–112)
Creatinine, Ser: 0.72 mg/dL (ref 0.50–1.35)
GFR calc Af Amer: >90 mL/min (ref >90)
GFR calc non Af Amer: >90 mL/min (ref >90)
Glucose, Bld: 122 mg/dL — ABNORMAL HIGH (ref 70–99)
Potassium: 3.8 mEq/L (ref 3.5–5.1)
Sodium: 140 mEq/L (ref 135–145)

## 2012-03-03 LAB — POCT I-STAT, CHEM 8
Calcium, Ion: 1.22 mmol/L (ref 1.12–1.23)
Creatinine, Ser: 0.8 mg/dL (ref 0.50–1.35)
Glucose, Bld: 133 mg/dL — ABNORMAL HIGH (ref 70–99)
HCT: 31 % — ABNORMAL LOW (ref 39.0–52.0)
Hemoglobin: 10.5 g/dL — ABNORMAL LOW (ref 13.0–17.0)

## 2012-03-03 LAB — CBC
HCT: 24.7 % — ABNORMAL LOW (ref 39.0–52.0)
Hemoglobin: 8.7 g/dL — ABNORMAL LOW (ref 13.0–17.0)
MCH: 31.8 pg (ref 26.0–34.0)
MCHC: 35.2 g/dL (ref 30.0–36.0)
MCV: 90.1 fL (ref 78.0–100.0)
Platelets: 147 10*3/uL — ABNORMAL LOW (ref 150–400)
RBC: 2.74 MIL/uL — ABNORMAL LOW (ref 4.22–5.81)
RDW: 13 % (ref 11.5–15.5)
WBC: 10.4 10*3/uL (ref 4.0–10.5)

## 2012-03-03 LAB — GLUCOSE, CAPILLARY
Glucose-Capillary: 109 mg/dL — ABNORMAL HIGH (ref 70–99)
Glucose-Capillary: 110 mg/dL — ABNORMAL HIGH (ref 70–99)
Glucose-Capillary: 112 mg/dL — ABNORMAL HIGH (ref 70–99)
Glucose-Capillary: 129 mg/dL — ABNORMAL HIGH (ref 70–99)
Glucose-Capillary: 90 mg/dL (ref 70–99)

## 2012-03-03 MED ORDER — TRAMADOL HCL 50 MG PO TABS
50.0000 mg | ORAL_TABLET | ORAL | Status: DC | PRN
  Administered 2012-03-03 – 2012-03-06 (×16): 100 mg via ORAL
  Filled 2012-03-03 (×16): qty 2

## 2012-03-03 MED ORDER — LISINOPRIL 2.5 MG PO TABS
2.5000 mg | ORAL_TABLET | Freq: Every day | ORAL | Status: DC
  Administered 2012-03-04 – 2012-03-06 (×3): 2.5 mg via ORAL
  Filled 2012-03-03 (×5): qty 1

## 2012-03-03 MED ORDER — OXYCODONE HCL 5 MG PO TABS: 5.0000 mg | ORAL_TABLET | ORAL | Status: DC | PRN

## 2012-03-03 MED ORDER — ACETAMINOPHEN 325 MG PO TABS: 650.0000 mg | ORAL_TABLET | Freq: Four times a day (QID) | ORAL | Status: DC | PRN

## 2012-03-03 MED ORDER — PANTOPRAZOLE SODIUM 40 MG PO TBEC
40.0000 mg | DELAYED_RELEASE_TABLET | Freq: Every day | ORAL | Status: DC
  Administered 2012-03-04 – 2012-03-06 (×3): 40 mg via ORAL
  Filled 2012-03-03 (×3): qty 1

## 2012-03-03 MED ORDER — ONDANSETRON HCL 4 MG/2ML IJ SOLN: 4.0000 mg | Freq: Four times a day (QID) | INTRAMUSCULAR | Status: DC | PRN

## 2012-03-03 MED ORDER — DOCUSATE SODIUM 100 MG PO CAPS
200.0000 mg | ORAL_CAPSULE | Freq: Every day | ORAL | Status: DC
  Administered 2012-03-03 – 2012-03-05 (×3): 200 mg via ORAL
  Filled 2012-03-03 (×4): qty 2

## 2012-03-03 MED ORDER — ASPIRIN EC 325 MG PO TBEC
325.0000 mg | DELAYED_RELEASE_TABLET | Freq: Every day | ORAL | Status: DC
  Administered 2012-03-03 – 2012-03-06 (×4): 325 mg via ORAL
  Filled 2012-03-03 (×4): qty 1

## 2012-03-03 MED ORDER — BISACODYL 5 MG PO TBEC
10.0000 mg | DELAYED_RELEASE_TABLET | Freq: Every day | ORAL | Status: DC | PRN
  Administered 2012-03-04: 10 mg via ORAL
  Filled 2012-03-03: qty 2

## 2012-03-03 MED ORDER — SODIUM CHLORIDE 0.9 % IJ SOLN: 3.0000 mL | INTRAMUSCULAR | Status: DC | PRN

## 2012-03-03 MED ORDER — SODIUM CHLORIDE 0.9 % IV SOLN: 250.0000 mL | INTRAVENOUS | Status: DC | PRN

## 2012-03-03 MED ORDER — INSULIN ASPART 100 UNIT/ML ~~LOC~~ SOLN: 0.0000 [IU] | Freq: Three times a day (TID) | SUBCUTANEOUS | Status: DC

## 2012-03-03 MED ORDER — SODIUM CHLORIDE 0.9 % IJ SOLN
3.0000 mL | Freq: Two times a day (BID) | INTRAMUSCULAR | Status: DC
  Administered 2012-03-03 – 2012-03-05 (×6): 3 mL via INTRAVENOUS

## 2012-03-03 MED ORDER — MOVING RIGHT ALONG BOOK
Freq: Once | Status: AC
  Administered 2012-03-03: 08:00:00
  Filled 2012-03-03: qty 1

## 2012-03-03 MED ORDER — ONDANSETRON HCL 4 MG PO TABS: 4.0000 mg | ORAL_TABLET | Freq: Four times a day (QID) | ORAL | Status: DC | PRN

## 2012-03-03 MED ORDER — BISACODYL 10 MG RE SUPP
10.0000 mg | Freq: Every day | RECTAL | Status: DC | PRN
  Filled 2012-03-03: qty 1

## 2012-03-03 MED FILL — Sodium Chloride IV Soln 0.9%: INTRAVENOUS | Qty: 2000 | Status: AC

## 2012-03-03 MED FILL — Lidocaine HCl IV Inj 20 MG/ML: INTRAVENOUS | Qty: 5 | Status: AC

## 2012-03-03 MED FILL — Heparin Sodium (Porcine) Inj 1000 Unit/ML: INTRAMUSCULAR | Qty: 30 | Status: AC

## 2012-03-03 MED FILL — Mannitol IV Soln 20%: INTRAVENOUS | Qty: 500 | Status: AC

## 2012-03-03 MED FILL — Heparin Sodium (Porcine) Inj 1000 Unit/ML: INTRAMUSCULAR | Qty: 10 | Status: AC

## 2012-03-03 MED FILL — Sodium Chloride Irrigation Soln 0.9%: Qty: 3000 | Status: AC

## 2012-03-03 MED FILL — Electrolyte-R (PH 7.4) Solution: INTRAVENOUS | Qty: 4000 | Status: AC

## 2012-03-03 MED FILL — Sodium Bicarbonate IV Soln 8.4%: INTRAVENOUS | Qty: 50 | Status: AC

## 2012-03-03 NOTE — Progress Notes (Signed)
Patient ID: Mark Moyer, male   DOB: [birth year], 51 y.o.   MRN: [id number] TCTS DAILY PROGRESS NOTE                   [address]            [place] [address]          [phone]      2 Days Post-Op Procedure(s) (LRB): CORONARY ARTERY BYPASS GRAFTING (CABG) (N/A) TRANSESOPHAGEAL ECHOCARDIOGRAM (TEE) (N/A)  Total Length of Stay:  LOS: 5 days   Subjective: Up to chair, ambulating 100 feet this am  Objective: Vital signs in last 24 hours: Temp:  [98.5 F (36.9 C)-99.9 F (37.7 C)] 99.9 F (37.7 C) (08/16 0735) Pulse Rate:  [63-79] 79  (08/16 0700) Cardiac Rhythm:  [-] Normal sinus rhythm (08/16 0400) Resp:  [14-29] 19  (08/16 0700) BP: (105-144)/(54-93) 139/93 mmHg (08/16 0600) SpO2:  [91 %-100 %] 96 % (08/16 0700) Arterial Line BP: (148-191)/(53-88) 149/64 mmHg (08/15 1700) Weight:  [184 lb 15.5 oz (83.9 kg)] 184 lb 15.5 oz (83.9 kg) (08/16 0700)  Filed Weights   02/29/12 2200 03/02/12 0400 03/03/12 0700  Weight: 187 lb 9.8 oz (85.1 kg) 188 lb 7.9 oz (85.5 kg) 184 lb 15.5 oz (83.9 kg)    Weight change: -3 lb 8.4 oz (-1.6 kg)   Hemodynamic parameters for last 24 hours: PAP: (19-26)/(9-11) 19/9 mmHg  Intake/Output from previous day: 08/15 0701 - 08/16 0700 In: 841 [P.O.:120; I.V.:671; IV Piggyback:50] Out: 1985 [Urine:1845; Chest Tube:140]  Intake/Output this shift:    Current Meds: Scheduled Meds:   . acetaminophen  1,000 mg Oral Q6H   Or  . acetaminophen (TYLENOL) oral liquid 160 mg/5 mL  975 mg Per Tube Q6H  . aspirin EC  325 mg Oral Daily   Or  . aspirin  324 mg Per Tube Daily  . atorvastatin  80 mg Oral q1800  . bisacodyl  10 mg Oral Daily   Or  . bisacodyl  10 mg Rectal Daily  . carvedilol  3.125 mg Oral BID WC  . cefUROXime (ZINACEF)  IV  1.5 g Intravenous Q12H  . docusate sodium  200 mg Oral Daily  . enoxaparin (LOVENOX) injection  40 mg Subcutaneous Q24H  . famotidine (PEPCID) IV  20 mg Intravenous Q12H  . insulin aspart   0-24 Units Subcutaneous Q2H   Followed by  . insulin aspart  0-24 Units Subcutaneous TID WC & HS  . ketorolac  15 mg Intravenous Q8H  . pantoprazole  40 mg Oral Q1200  . sodium chloride  3 mL Intravenous Q12H  . DISCONTD: insulin aspart  0-24 Units Subcutaneous Q4H  . DISCONTD: insulin aspart  0-24 Units Subcutaneous Q4H  . DISCONTD: insulin regular  0-10 Units Intravenous TID WC  . DISCONTD: metoprolol tartrate  12.5 mg Per Tube BID   Continuous Infusions:   . sodium chloride 20 mL/hr (03/01/12 1530)  . sodium chloride 20 mL/hr (03/01/12 1530)  . sodium chloride    . dexmedetomidine 0.3 mcg/kg/hr (03/01/12 2000)  . lactated ringers 20 mL/hr (03/01/12 1607)  . nitroGLYCERIN Stopped (03/02/12 1700)  . phenylephrine (NEO-SYNEPHRINE) Adult infusion Stopped (03/01/12 1606)  . DISCONTD: insulin (NOVOLIN-R) infusion 1.5 Units/hr (03/01/12 1530)   PRN Meds:.albumin human, albuterol, fentaNYL, metoprolol, midazolam, ondansetron (ZOFRAN) IV, oxyCODONE, sodium chloride  General appearance: alert and cooperative Neurologic: intact Heart: regular rate and rhythm, S1, S2 normal, no murmur, click, rub or gallop and normal  apical impulse Lungs: clear to auscultation bilaterally and normal percussion bilaterally Abdomen: soft, non-tender; bowel sounds normal; no masses,  no organomegaly Extremities: extremities normal, atraumatic, no cyanosis or edema and Homans sign is negative, no sign of DVT Wound: sternum stable  Lab Results: CBC: Basename 03/03/12 0430 03/02/12 1840  WBC 10.4 15.3*  HGB 8.7* 9.9*  HCT 24.7* 28.1*  PLT 147* 189   BMET:  Basename 03/03/12 0430 03/02/12 1840 03/02/12 1826 03/02/12 0400  NA 140 -- 136 --  K 3.8 -- 3.8 --  CL 105 -- 100 --  CO2 27 -- -- 24  GLUCOSE 122* -- 133* --  BUN 12 -- 12 --  CREATININE 0.72 0.68 -- --  CALCIUM 8.5 -- -- 8.3*    PT/INR:  Basename 03/01/12 1500  LABPROT 17.1*  INR 1.37   Radiology: Dg Chest Port 1 View  03/03/2012   *RADIOLOGY REPORT*  Clinical Data: Postoperative examination (CABG)  PORTABLE CHEST - 1 VIEW  Comparison: 03/02/2012; 03/01/2012; 02/29/2012  Findings:  Grossly unchanged cardiac silhouette and mediastinal contours post median sternotomy and CABG.  Interval removal of right jugular approach PA catheter with the remaining vascular sheath tip projecting over the mid SVC.  Interval removal of left-sided chest tube and mediastinal drain.  No pneumothorax.  Minimal bibasilar heterogeneous opacities, left greater than right, favored to represent atelectasis.  No new focal airspace opacities. Trace bilateral pleural effusions are suspected.  Unchanged bones.  IMPRESSION: 1.  Interval removal of support apparatus as above.  No pneumothorax. 2.  Trace bilateral effusions and bibasilar opacities, left greater than right, favored to represent atelectasis.  Original Report Authenticated By: Waynard Reeds, M.D.   Dg Chest Portable 1 View In Am  03/02/2012  *RADIOLOGY REPORT*  Clinical Data: Postop CABG  PORTABLE CHEST - 1 VIEW  Comparison: Portable chest x-ray of 03/01/2012  Findings: The endotracheal tube has been removed.  There is little change in aeration with only mild basilar volume loss present.  No pneumothorax is seen, and left chest tube remains.  Cardiomegaly stable.  Swan-Ganz catheter tip is unchanged in position.  IMPRESSION: Endotracheal tube removed.  Slightly better aeration with only minimal basilar atelectasis.  Original Report Authenticated By: Juline Patch, M.D.   Dg Chest Portable 1 View  03/01/2012  *RADIOLOGY REPORT*  Clinical Data: Postoperative studies status post CABG.  PORTABLE CHEST - 1 VIEW  Comparison: Chest x-ray 02/29/2012.  Findings: An endotracheal tube is in place with tip 7.4 cm above the carina. Left-sided chest tube in position with tip projecting over the left apex. A nasogastric tube is seen extending into the stomach, however, the tip of the nasogastric tube extends below the  lower margin of the image.  Right-sided internal jugular cordis through which a Theone Murdoch catheter has been passed into the proximal right main pulmonary artery.  Lung volumes are low. Minimal bibasilar opacities are consistent with some mild postoperative atelectasis.  Probable small left-sided pleural effusion.  No appreciable pneumothorax.  Pulmonary vasculature is normal.  Cardiomediastinal silhouette is within normal limits. Status post median sternotomy for CABG.  IMPRESSION: 1.  Postoperative changes and support apparatus, as above. 2.  Low lung volumes with minimal bibasilar subsegmental atelectasis and probable small left-sided pleural effusion.  Original Report Authenticated By: Florencia Reasons, M.D.     Assessment/Plan: S/P Procedure(s) (LRB): CORONARY ARTERY BYPASS GRAFTING (CABG) (N/A) TRANSESOPHAGEAL ECHOCARDIOGRAM (TEE) (N/A) Mobilize Diuresis Plan for transfer to step-down: see transfer orders Expected Acute  Blood - loss Anemia     Delight Ovens MD  Beeper [phone] Office [phone] 03/03/2012 7:41 AM

## 2012-03-03 NOTE — Progress Notes (Signed)
Nurse paged Dr. Laneta Simmers concerning patient's elevated BP of 160/70. Dr. Laneta Simmers informed the nurse that they do not prescribe PRN BP meds. Dr. Laneta Simmers gave instructions to just monitor the patient's BP, due to the patient having receive his scheduled doses of Coreg twice today. Nurse will perform as instructed. Harmon Pier

## 2012-03-03 NOTE — Progress Notes (Signed)
2 Days Post-Op  Procedure(s) (LRB):  CORONARY ARTERY BYPASS GRAFTING (CABG) (N/A)  TRANSESOPHAGEAL ECHOCARDIOGRAM    Subjective: No complaints, ambulating earlier  Objective: Vital signs in last 24 hours: Temp:  [98.5 F (36.9 C)-99.9 F (37.7 C)] 99.9 F (37.7 C) (08/16 0735) Pulse Rate:  [63-79] 73  (08/16 0800) Resp:  [14-29] 24  (08/16 0800) BP: (105-144)/(54-93) 129/72 mmHg (08/16 0800) SpO2:  [91 %-99 %] 93 % (08/16 0800) Arterial Line BP: (148-190)/(53-82) 149/64 mmHg (08/15 1700) Weight:  [83.9 kg (184 lb 15.5 oz)] 83.9 kg (184 lb 15.5 oz) (08/16 0700) Weight change: -1.6 kg (-3 lb 8.4 oz) Last BM Date: 02/27/12 Intake/Output from previous day:  -1148  Wt 83.9, down from 85.1 08/15 0701 - 08/16 0700 In: 841 [P.O.:120; I.V.:671; IV Piggyback:50] Out: 1985 [Urine:1845; Chest Tube:140] Intake/Output this shift: Total I/O In: 20 [I.V.:20] Out: 75 [Urine:75]  PE: General:alert and oriented,MAE,no further A. Fib from admit Heart:s1S2 RRR Lungs:decreased somewhat otherwise clear Abd:+ BS, soft, nontender Ext:no edema    Lab Results:  Basename 03/03/12 0430 03/02/12 1840  WBC 10.4 15.3*  HGB 8.7* 9.9*  HCT 24.7* 28.1*  PLT 147* 189   BMET  Basename 03/03/12 0430 03/02/12 1840 03/02/12 1826 03/02/12 0400  NA 140 -- 136 --  K 3.8 -- 3.8 --  CL 105 -- 100 --  CO2 27 -- -- 24  GLUCOSE 122* -- 133* --  BUN 12 -- 12 --  CREATININE 0.72 0.68 -- --  CALCIUM 8.5 -- -- 8.3*   No results found for this basename: TROPONINI:2,CK,MB:2 in the last 72 hours  Lab Results  Component Value Date   CHOL 259* 02/28/2012   HDL 43 02/28/2012   LDLCALC 177* 02/28/2012   TRIG 194* 02/28/2012   CHOLHDL 6.0 02/28/2012   Lab Results  Component Value Date   HGBA1C 5.6 02/29/2012     Lab Results  Component Value Date   TSH 4.061 02/28/2012    Hepatic Function Panel  Basename 02/29/12 1855  PROT 7.1  ALBUMIN 4.0  AST 46*  ALT 12  ALKPHOS 50  BILITOT 0.9  BILIDIR --    IBILI --   No results found for this basename: CHOL in the last 72 hours No results found for this basename: PROTIME in the last 72 hours    EKG: Orders placed during the hospital encounter of 02/27/12  . EKG 12-LEAD  . EKG 12-LEAD  . EKG 12-LEAD  . EKG 12-LEAD  . EKG 12-LEAD  . EKG 12-LEAD    Studies/Results: Dg Chest Port 1 View  03/03/2012  *RADIOLOGY REPORT*  Clinical Data: Postoperative examination (CABG)  PORTABLE CHEST - 1 VIEW  Comparison: 03/02/2012; 03/01/2012; 02/29/2012  Findings:  Grossly unchanged cardiac silhouette and mediastinal contours post median sternotomy and CABG.  Interval removal of right jugular approach PA catheter with the remaining vascular sheath tip projecting over the mid SVC.  Interval removal of left-sided chest tube and mediastinal drain.  No pneumothorax.  Minimal bibasilar heterogeneous opacities, left greater than right, favored to represent atelectasis.  No new focal airspace opacities. Trace bilateral pleural effusions are suspected.  Unchanged bones.  IMPRESSION: 1.  Interval removal of support apparatus as above.  No pneumothorax. 2.  Trace bilateral effusions and bibasilar opacities, left greater than right, favored to represent atelectasis.  Original Report Authenticated By: Waynard Reeds, M.D.   Dg Chest Portable 1 View In Am  03/02/2012  *RADIOLOGY REPORT*  Clinical Data: Postop CABG  PORTABLE CHEST - 1 VIEW  Comparison: Portable chest x-ray of 03/01/2012  Findings: The endotracheal tube has been removed.  There is little change in aeration with only mild basilar volume loss present.  No pneumothorax is seen, and left chest tube remains.  Cardiomegaly stable.  Swan-Ganz catheter tip is unchanged in position.  IMPRESSION: Endotracheal tube removed.  Slightly better aeration with only minimal basilar atelectasis.  Original Report Authenticated By: Juline Patch, M.D.   Dg Chest Portable 1 View  03/01/2012  *RADIOLOGY REPORT*  Clinical Data:  Postoperative studies status post CABG.  PORTABLE CHEST - 1 VIEW  Comparison: Chest x-ray 02/29/2012.  Findings: An endotracheal tube is in place with tip 7.4 cm above the carina. Left-sided chest tube in position with tip projecting over the left apex. A nasogastric tube is seen extending into the stomach, however, the tip of the nasogastric tube extends below the lower margin of the image.  Right-sided internal jugular cordis through which a Theone Murdoch catheter has been passed into the proximal right main pulmonary artery.  Lung volumes are low. Minimal bibasilar opacities are consistent with some mild postoperative atelectasis.  Probable small left-sided pleural effusion.  No appreciable pneumothorax.  Pulmonary vasculature is normal.  Cardiomediastinal silhouette is within normal limits. Status post median sternotomy for CABG.  IMPRESSION: 1.  Postoperative changes and support apparatus, as above. 2.  Low lung volumes with minimal bibasilar subsegmental atelectasis and probable small left-sided pleural effusion.  Original Report Authenticated By: Florencia Reasons, M.D.    Medications: I have reviewed the patient's current medications.    Marland Kitchen aspirin EC  325 mg Oral Daily  . atorvastatin  80 mg Oral q1800  . carvedilol  3.125 mg Oral BID WC  . docusate sodium  200 mg Oral Daily  . enoxaparin (LOVENOX) injection  40 mg Subcutaneous Q24H  . famotidine (PEPCID) IV  20 mg Intravenous Q12H  . insulin aspart  0-24 Units Subcutaneous Q2H  . insulin aspart  0-24 Units Subcutaneous TID AC & HS  . lisinopril  2.5 mg Oral Daily  . moving right along book   Does not apply Once  . pantoprazole  40 mg Oral QAC breakfast  . sodium chloride  3 mL Intravenous Q12H  . DISCONTD: acetaminophen (TYLENOL) oral liquid 160 mg/5 mL  975 mg Per Tube Q6H  . DISCONTD: acetaminophen  1,000 mg Oral Q6H  . DISCONTD: aspirin  324 mg Per Tube Daily  . DISCONTD: aspirin EC  325 mg Oral Daily  . DISCONTD: bisacodyl  10 mg Oral  Daily  . DISCONTD: bisacodyl  10 mg Rectal Daily  . DISCONTD: cefUROXime (ZINACEF)  IV  1.5 g Intravenous Q12H  . DISCONTD: docusate sodium  200 mg Oral Daily  . DISCONTD: insulin aspart  0-24 Units Subcutaneous Q4H  . DISCONTD: insulin aspart  0-24 Units Subcutaneous Q4H  . DISCONTD: insulin aspart  0-24 Units Subcutaneous TID WC & HS  . DISCONTD: insulin regular  0-10 Units Intravenous TID WC  . DISCONTD: ketorolac  15 mg Intravenous Q8H  . DISCONTD: metoprolol tartrate  12.5 mg Per Tube BID  . DISCONTD: pantoprazole  40 mg Oral Q1200  . DISCONTD: sodium chloride  3 mL Intravenous Q12H   Assessment/Plan: Principal Problem:  *NSTEMI (non-ST elevated myocardial infarction) Active Problems:  Dyslipidemia  Atrial fibrillation with rapid ventricular response - converted spontaneously with resoluition of Chest pain after NTG  CAD, elective CABG X 7 Aug 14th 2013  Family  history of coronary artery bypass graft  Postoperative anemia due to acute blood loss  PLAN:POST Op day 2  To transfer to 2000 today.   On coreg and Ace.  LOS: 5 days   INGOLD,LAURA R 03/03/2012, 8:29 AM   Agree with note written by Nada Boozer RNP  POD #2 CABG X 7. Progressing nicely. Ambulating. VSS. Labs OK. Exam benign. Plan transfer to 2000.  Runell Gess 03/03/2012 8:36 AM

## 2012-03-04 LAB — GLUCOSE, CAPILLARY
Glucose-Capillary: 104 mg/dL — ABNORMAL HIGH (ref 70–99)
Glucose-Capillary: 112 mg/dL — ABNORMAL HIGH (ref 70–99)
Glucose-Capillary: 119 mg/dL — ABNORMAL HIGH (ref 70–99)

## 2012-03-04 LAB — BASIC METABOLIC PANEL
BUN: 12 mg/dL (ref 6–23)
CO2: 26 mEq/L (ref 19–32)
Calcium: 8.7 mg/dL (ref 8.4–10.5)
Chloride: 101 mEq/L (ref 96–112)
Creatinine, Ser: 0.62 mg/dL (ref 0.50–1.35)
GFR calc Af Amer: >90 mL/min (ref >90)
GFR calc non Af Amer: >90 mL/min (ref >90)
Glucose, Bld: 97 mg/dL (ref 70–99)
Potassium: 3.4 mEq/L — ABNORMAL LOW (ref 3.5–5.1)
Sodium: 137 mEq/L (ref 135–145)

## 2012-03-04 LAB — CBC
HCT: 24.6 % — ABNORMAL LOW (ref 39.0–52.0)
Hemoglobin: 8.5 g/dL — ABNORMAL LOW (ref 13.0–17.0)
MCH: 31.5 pg (ref 26.0–34.0)
MCHC: 34.6 g/dL (ref 30.0–36.0)
MCV: 91.1 fL (ref 78.0–100.0)
Platelets: 185 10*3/uL (ref 150–400)
RBC: 2.7 MIL/uL — ABNORMAL LOW (ref 4.22–5.81)
RDW: 12.9 % (ref 11.5–15.5)
WBC: 12 10*3/uL — ABNORMAL HIGH (ref 4.0–10.5)

## 2012-03-04 MED ORDER — POTASSIUM CHLORIDE CRYS ER 20 MEQ PO TBCR
40.0000 meq | EXTENDED_RELEASE_TABLET | Freq: Once | ORAL | Status: AC
  Administered 2012-03-04: 40 meq via ORAL
  Filled 2012-03-04: qty 2

## 2012-03-04 MED ORDER — CARVEDILOL 6.25 MG PO TABS
6.2500 mg | ORAL_TABLET | Freq: Two times a day (BID) | ORAL | Status: DC
  Administered 2012-03-05: 6.25 mg via ORAL
  Filled 2012-03-04 (×4): qty 1

## 2012-03-04 NOTE — Progress Notes (Signed)
CARDIAC REHAB PHASE I   PRE:  Rate/Rhythm: SR 64  BP:  Supine:   Sitting: 151/90  Standing:    SaO2: 96 RA  MODE:  Ambulation: 550 ft   POST:  Rate/Rhythem:SR 84  BP:  Supine:   Sitting: 161/107 Auto cuff rk 160/90 manual cuff  Standing:    SaO2: 96 RA  0815 pt in bed and requested rehab staff to return when wife arrives at 1000.  1009 Pt  in chair and pt reported that his wife was on her way and would arrive in 5 minutes.  Pt requested rehab staff return. Pt wife arrived for ambulation.  Vital signs with elevation of BP.  Pt remarked that his  Pt ambulated 650 feet x 1 assist and wheelchair per pt request in case he tired and needed to be wheeled back to the room.  Pt to chair for am care by the wife.  Bp rechecked manually due to high auto cuff reading.  Pt reports that his BP has been running high since surgery.  Pt c/o some sternal incision discomfort and requested pain medication.  Primary RN notified of pt's request. Call bell in place, wife at bedside.  Les Pou, [place]

## 2012-03-04 NOTE — Progress Notes (Signed)
Held Coreg due to SB 52-57 sustained. BP WNL. Will inform night shift RN so that she may give if HR rises and patient can tolerate medication Cletis [place], Ellery Plunk

## 2012-03-04 NOTE — Progress Notes (Signed)
[facility]  DAILY PROGRESS NOTE   Subjective:  Incisional pain is only complaint. When analgetics wear off he has elevated BP. No arrhythmia. No dyspnea.  Objective:  Temp:  [98.9 F (37.2 C)-99.4 F (37.4 C)] 99.4 F (37.4 C) (08/17 0400) Pulse Rate:  [70-82] 71  (08/17 0400) Resp:  [18-23] 18  (08/17 0400) BP: (134-160)/(61-88) 160/70 mmHg (08/17 0400) SpO2:  [91 %-96 %] 91 % (08/17 0400) Weight:  [82.2 kg (181 lb 3.5 oz)] 82.2 kg (181 lb 3.5 oz) (08/17 0400) Weight change: -1.7 kg (-3 lb 12 oz)  Intake/Output from previous day: 08/16 0701 - 08/17 0700 In: 143 [P.O.:120; I.V.:23] Out: 1625 [Urine:1625]  Intake/Output from this shift:    Medications: Current Facility-Administered Medications  Medication Dose Route Frequency Provider Last Rate Last Dose  . 0.9 %  sodium chloride infusion  250 mL Intravenous PRN Delight Ovens, MD      . acetaminophen (TYLENOL) tablet 650 mg  650 mg Oral Q6H PRN Delight Ovens, MD      . aspirin EC tablet 325 mg  325 mg Oral Daily Delight Ovens, MD   325 mg at 03/03/12 1004  . atorvastatin (LIPITOR) tablet 80 mg  80 mg Oral q1800 Leroy Sea, MD   80 mg at 03/03/12 1747  . bisacodyl (DULCOLAX) EC tablet 10 mg  10 mg Oral Daily PRN Delight Ovens, MD       Or  . bisacodyl (DULCOLAX) suppository 10 mg  10 mg Rectal Daily PRN Delight Ovens, MD      . carvedilol (COREG) tablet 3.125 mg  3.125 mg Oral BID WC Marykay Lex, MD   3.125 mg at 03/04/12 0807  . docusate sodium (COLACE) capsule 200 mg  200 mg Oral Daily Delight Ovens, MD   200 mg at 03/03/12 1004  . enoxaparin (LOVENOX) injection 40 mg  40 mg Subcutaneous Q24H Delight Ovens, MD   40 mg at 03/03/12 1005  . insulin aspart (novoLOG) injection 0-24 Units  0-24 Units Subcutaneous TID AC & HS Delight Ovens, MD      . lisinopril (PRINIVIL,ZESTRIL) tablet 2.5 mg  2.5 mg Oral Daily Delight Ovens, MD      . ondansetron  [facility]) tablet 4 mg  4 mg Oral Q6H PRN Delight Ovens, MD       Or  . ondansetron [facility]) injection 4 mg  4 mg Intravenous Q6H PRN Delight Ovens, MD      . oxyCODONE (Oxy IR/ROXICODONE) immediate release tablet 5-10 mg  5-10 mg Oral Q3H PRN Delight Ovens, MD      . pantoprazole (PROTONIX) EC tablet 40 mg  40 mg Oral QAC breakfast Delight Ovens, MD   40 mg at 03/04/12 1914  . sodium chloride 0.9 % injection 3 mL  3 mL Intravenous Q12H Delight Ovens, MD   3 mL at 03/03/12 2132  . sodium chloride 0.9 % injection 3 mL  3 mL Intravenous PRN Delight Ovens, MD      . traMADol Janean Sark) tablet 50-100 mg  50-100 mg Oral Q4H PRN Delight Ovens, MD   100 mg at 03/04/12 7829    Physical Exam: General appearance: alert, cooperative and no distress Neck: no adenopathy, no carotid bruit, no JVD, supple, symmetrical, trachea midline and thyroid not enlarged, symmetric, no tenderness/mass/nodules Lungs: clear to auscultation bilaterally Heart: regular rate and rhythm, S1, S2  normal, no murmur, click, rub or gallop Abdomen: soft, non-tender; bowel sounds normal; no masses,  no organomegaly Extremities: extremities normal, atraumatic, no cyanosis or edema Pulses: 2+ and symmetric Skin: Skin color, texture, turgor normal. No rashes or lesions Neurologic: Alert and oriented X 3, normal strength and tone. Normal symmetric reflexes. Normal coordination and gait  Lab Results: Results for orders placed during the hospital encounter of 02/27/12 (from the past 48 hour(s))  GLUCOSE, CAPILLARY     Status: Abnormal   Collection Time   03/02/12 11:14 AM      Component Value Range Comment   Glucose-Capillary 118 (*) 70 - 99 mg/dL    Comment 1 Notify RN      Comment 2 Documented in Chart     GLUCOSE, CAPILLARY     Status: Abnormal   Collection Time   03/02/12  3:50 PM      Component Value Range Comment   Glucose-Capillary 121 (*) 70 - 99 mg/dL    Comment 1 Notify RN      Comment 2  Documented in Chart     POCT I-STAT, CHEM 8     Status: Abnormal   Collection Time   03/02/12  6:26 PM      Component Value Range Comment   Sodium 136  135 - 145 mEq/L    Potassium 3.8  3.5 - 5.1 mEq/L    Chloride 100  96 - 112 mEq/L    BUN 12  6 - 23 mg/dL    Creatinine, Ser 0.98  0.50 - 1.35 mg/dL    Glucose, Bld 119 (*) 70 - 99 mg/dL    Calcium, Ion 1.47  8.29 - 1.23 mmol/L    TCO2 23  0 - 100 mmol/L    Hemoglobin 10.5 (*) 13.0 - 17.0 g/dL    HCT 56.2 (*) 13.0 - 52.0 %   MAGNESIUM     Status: Normal   Collection Time   03/02/12  6:40 PM      Component Value Range Comment   Magnesium 2.1  1.5 - 2.5 mg/dL   CBC     Status: Abnormal   Collection Time   03/02/12  6:40 PM      Component Value Range Comment   WBC 15.3 (*) 4.0 - 10.5 K/uL    RBC 3.12 (*) 4.22 - 5.81 MIL/uL    Hemoglobin 9.9 (*) 13.0 - 17.0 g/dL    HCT 86.5 (*) 78.4 - 52.0 %    MCV 90.1  78.0 - 100.0 fL    MCH 31.7  26.0 - 34.0 pg    MCHC 35.2  30.0 - 36.0 g/dL    RDW 69.6  29.5 - 28.4 %    Platelets 189  150 - 400 K/uL   CREATININE, SERUM     Status: Normal   Collection Time   03/02/12  6:40 PM      Component Value Range Comment   Creatinine, Ser 0.68  0.50 - 1.35 mg/dL    GFR calc non Af Amer >90  >90 mL/min    GFR calc Af Amer >90  >90 mL/min   GLUCOSE, CAPILLARY     Status: Abnormal   Collection Time   03/02/12  8:05 PM      Component Value Range Comment   Glucose-Capillary 109 (*) 70 - 99 mg/dL    Comment 1 Documented in Chart      Comment 2 Notify RN     GLUCOSE, CAPILLARY  Status: Abnormal   Collection Time   03/03/12 12:08 AM      Component Value Range Comment   Glucose-Capillary 129 (*) 70 - 99 mg/dL   BASIC METABOLIC PANEL     Status: Abnormal   Collection Time   03/03/12  4:30 AM      Component Value Range Comment   Sodium 140  135 - 145 mEq/L    Potassium 3.8  3.5 - 5.1 mEq/L    Chloride 105  96 - 112 mEq/L    CO2 27  19 - 32 mEq/L    Glucose, Bld 122 (*) 70 - 99 mg/dL    BUN 12  6 - 23  mg/dL    Creatinine, Ser 1.61  0.50 - 1.35 mg/dL    Calcium 8.5  8.4 - 09.6 mg/dL    GFR calc non Af Amer >90  >90 mL/min    GFR calc Af Amer >90  >90 mL/min   CBC     Status: Abnormal   Collection Time   03/03/12  4:30 AM      Component Value Range Comment   WBC 10.4  4.0 - 10.5 K/uL    RBC 2.74 (*) 4.22 - 5.81 MIL/uL    Hemoglobin 8.7 (*) 13.0 - 17.0 g/dL    HCT 04.5 (*) 40.9 - 52.0 %    MCV 90.1  78.0 - 100.0 fL    MCH 31.8  26.0 - 34.0 pg    MCHC 35.2  30.0 - 36.0 g/dL    RDW 81.1  91.4 - 78.2 %    Platelets 147 (*) 150 - 400 K/uL   GLUCOSE, CAPILLARY     Status: Abnormal   Collection Time   03/03/12  7:33 AM      Component Value Range Comment   Glucose-Capillary 112 (*) 70 - 99 mg/dL    Comment 1 Notify RN      Comment 2 Documented in Chart     GLUCOSE, CAPILLARY     Status: Abnormal   Collection Time   03/03/12 11:01 AM      Component Value Range Comment   Glucose-Capillary 116 (*) 70 - 99 mg/dL    Comment 1 Notify RN      Comment 2 Documented in Chart     GLUCOSE, CAPILLARY     Status: Abnormal   Collection Time   03/03/12  4:16 PM      Component Value Range Comment   Glucose-Capillary 104 (*) 70 - 99 mg/dL    Comment 1 Documented in Chart      Comment 2 Notify RN     GLUCOSE, CAPILLARY     Status: Abnormal   Collection Time   03/03/12  8:44 PM      Component Value Range Comment   Glucose-Capillary 112 (*) 70 - 99 mg/dL   CBC     Status: Abnormal   Collection Time   03/04/12  5:34 AM      Component Value Range Comment   WBC 12.0 (*) 4.0 - 10.5 K/uL    RBC 2.70 (*) 4.22 - 5.81 MIL/uL    Hemoglobin 8.5 (*) 13.0 - 17.0 g/dL    HCT 95.6 (*) 21.3 - 52.0 %    MCV 91.1  78.0 - 100.0 fL    MCH 31.5  26.0 - 34.0 pg    MCHC 34.6  30.0 - 36.0 g/dL    RDW 08.6  57.8 - 46.9 %    Platelets  185  150 - 400 K/uL   BASIC METABOLIC PANEL     Status: Abnormal   Collection Time   03/04/12  5:34 AM      Component Value Range Comment   Sodium 137  135 - 145 mEq/L    Potassium  3.4 (*) 3.5 - 5.1 mEq/L    Chloride 101  96 - 112 mEq/L    CO2 26  19 - 32 mEq/L    Glucose, Bld 97  70 - 99 mg/dL    BUN 12  6 - 23 mg/dL    Creatinine, Ser 1.61  0.50 - 1.35 mg/dL    Calcium 8.7  8.4 - 09.6 mg/dL    GFR calc non Af Amer >90  >90 mL/min    GFR calc Af Amer >90  >90 mL/min   GLUCOSE, CAPILLARY     Status: Abnormal   Collection Time   03/04/12  6:06 AM      Component Value Range Comment   Glucose-Capillary 104 (*) 70 - 99 mg/dL     Imaging: Dg Chest Port 1 View  03/03/2012  *RADIOLOGY REPORT*  Clinical Data: Postoperative examination (CABG)  PORTABLE CHEST - 1 VIEW  Comparison: 03/02/2012; 03/01/2012; 02/29/2012  Findings:  Grossly unchanged cardiac silhouette and mediastinal contours post median sternotomy and CABG.  Interval removal of right jugular approach PA catheter with the remaining vascular sheath tip projecting over the mid SVC.  Interval removal of left-sided chest tube and mediastinal drain.  No pneumothorax.  Minimal bibasilar heterogeneous opacities, left greater than right, favored to represent atelectasis.  No new focal airspace opacities. Trace bilateral pleural effusions are suspected.  Unchanged bones.  IMPRESSION: 1.  Interval removal of support apparatus as above.  No pneumothorax. 2.  Trace bilateral effusions and bibasilar opacities, left greater than right, favored to represent atelectasis.  Original Report Authenticated By: Waynard Reeds, M.D.    Assessment:  1. Principal Problem: 2.  *NSTEMI (non-ST elevated myocardial infarction) 3. Active Problems: 4.  Dyslipidemia 5.  Atrial fibrillation with rapid ventricular response - converted spontaneously with resoluition of Chest pain after NTG 6.  CAD, elective CABG X 7 Aug 14th 2013 7.  Family history of coronary artery bypass graft 8.  Postoperative anemia due to acute blood loss 9.   Plan:  1. Increase beta blocker slightly to help with BP and also since he had preop AF. Note initial  diagnosis of HBP 15 years ago, treated successfully with sodium restriction and physical exercise. Long term he may not need a high dose of beta blocker.  Time Spent Directly with Patient:  30 minutes  Length of Stay:  LOS: 6 days    Mark Moyer 03/04/2012, 8:58 AM

## 2012-03-04 NOTE — Progress Notes (Signed)
Patient ambulated with RW and wife (2nd walk today). Tolerated well. No complaints during walk or after. Encouraged to walk once more today. Harlow Asa

## 2012-03-04 NOTE — Progress Notes (Addendum)
[address]            [place] [address]          [phone]     3 Days Post-Op  Procedure(s) (LRB): CORONARY ARTERY BYPASS GRAFTING (CABG) (N/A) TRANSESOPHAGEAL ECHOCARDIOGRAM (TEE) (N/A) Subjective: feeliing ok , some constipation  Objective  Telemetry sinus, occ pvc's   Temp:  [98.9 F (37.2 C)-99.4 F (37.4 C)] 99.4 F (37.4 C) (08/17 0400) Pulse Rate:  [70-76] 71  (08/17 0400) Resp:  [18-20] 18  (08/17 0400) BP: (140-160)/(70-88) 160/70 mmHg (08/17 0400) SpO2:  [91 %-96 %] 91 % (08/17 0400) Weight:  [181 lb 3.5 oz (82.2 kg)] 181 lb 3.5 oz (82.2 kg) (08/17 0400)   Intake/Output Summary (Last 24 hours) at 03/04/12 1003 Last data filed at 03/04/12 0026  Gross per 24 hour  Intake      3 ml  Output   1550 ml  Net  -1547 ml       General appearance: alert, cooperative and no distress Heart: regular rate and rhythm and S1, S2 normal Lungs: mildly diminished in bases Abdomen: + BS, soft, nontender Extremities: min edema Wound: incisions gealing well  Lab Results:  Basename 03/04/12 0534 03/03/12 0430 03/02/12 1840 03/02/12 0400  NA 137 140 -- --  K 3.4* 3.8 -- --  CL 101 105 -- --  CO2 26 27 -- --  GLUCOSE 97 122* -- --  BUN 12 12 -- --  CREATININE 0.62 0.72 -- --  CALCIUM 8.7 8.5 -- --  MG -- -- 2.1 2.4  PHOS -- -- -- --   No results found for this basename: AST:2,ALT:2,ALKPHOS:2,BILITOT:2,PROT:2,ALBUMIN:2 in the last 72 hours No results found for this basename: LIPASE:2,AMYLASE:2 in the last 72 hours  Basename 03/04/12 0534 03/03/12 0430  WBC 12.0* 10.4  NEUTROABS -- --  HGB 8.5* 8.7*  HCT 24.6* 24.7*  MCV 91.1 90.1  PLT 185 147*   No results found for this basename: CKTOTAL:4,CKMB:4,TROPONINI:4 in the last 72 hours No components found with this basename: POCBNP:3 No results found for this basename: DDIMER in the last 72 hours No results found for this basename: HGBA1C in the last 72 hours No results found  for this basename: CHOL,HDL,LDLCALC,TRIG,CHOLHDL in the last 72 hours No results found for this basename: TSH,T4TOTAL,FREET3,T3FREE,THYROIDAB in the last 72 hours No results found for this basename: VITAMINB12,FOLATE,FERRITIN,TIBC,IRON,RETICCTPCT in the last 72 hours  Medications: Scheduled    . aspirin EC  325 mg Oral Daily  . atorvastatin  80 mg Oral q1800  . carvedilol  6.25 mg Oral BID WC  . docusate sodium  200 mg Oral Daily  . enoxaparin (LOVENOX) injection  40 mg Subcutaneous Q24H  . insulin aspart  0-24 Units Subcutaneous TID AC & HS  . lisinopril  2.5 mg Oral Daily  . pantoprazole  40 mg Oral QAC breakfast  . sodium chloride  3 mL Intravenous Q12H  . DISCONTD: carvedilol  3.125 mg Oral BID WC     Radiology/Studies:  Dg Chest Port 1 View  03/03/2012  *RADIOLOGY REPORT*  Clinical Data: Postoperative examination (CABG)  PORTABLE CHEST - 1 VIEW  Comparison: 03/02/2012; 03/01/2012; 02/29/2012  Findings:  Grossly unchanged cardiac silhouette and mediastinal contours post median sternotomy and CABG.  Interval removal of right jugular approach PA catheter with the remaining vascular sheath tip projecting over the mid SVC.  Interval  removal of left-sided chest tube and mediastinal drain.  No pneumothorax.  Minimal bibasilar heterogeneous opacities, left greater than right, favored to represent atelectasis.  No new focal airspace opacities. Trace bilateral pleural effusions are suspected.  Unchanged bones.  IMPRESSION: 1.  Interval removal of support apparatus as above.  No pneumothorax. 2.  Trace bilateral effusions and bibasilar opacities, left greater than right, favored to represent atelectasis.  Original Report Authenticated By: Waynard Reeds, M.D.    INR: Will add last result for INR, ABG once components are confirmed Will add last 4 CBG results once components are confirmed  Assessment/Plan: S/P Procedure(s) (LRB): CORONARY ARTERY BYPASS GRAFTING (CABG) (N/A) TRANSESOPHAGEAL  ECHOCARDIOGRAM (TEE) (N/A)  1. Doing well 2  Replace K+ 3  Routine rehab/pulm toilet 4 anemia stable, mild leukocytosis     LOS: 6 days    Mark Moyer,Mark Moyer 8/17/201310:03 AM    Chart reviewed, patient examined, agree with above. He should be ready to go home by Monday.

## 2012-03-05 LAB — GLUCOSE, CAPILLARY
Glucose-Capillary: 94 mg/dL (ref 70–99)
Glucose-Capillary: 111 mg/dL — ABNORMAL HIGH (ref 70–99)

## 2012-03-05 MED ORDER — OXYCODONE HCL 5 MG PO TABS: 5.0000 mg | ORAL_TABLET | ORAL | Status: DC | PRN

## 2012-03-05 MED ORDER — CARVEDILOL 3.125 MG PO TABS
3.1250 mg | ORAL_TABLET | Freq: Two times a day (BID) | ORAL | Status: DC
  Administered 2012-03-05 – 2012-03-06 (×2): 3.125 mg via ORAL
  Filled 2012-03-05 (×4): qty 1

## 2012-03-05 MED ORDER — CARVEDILOL 3.125 MG PO TABS: 3.1250 mg | ORAL_TABLET | Freq: Two times a day (BID) | ORAL | Status: DC

## 2012-03-05 MED ORDER — LISINOPRIL 2.5 MG PO TABS: 2.5000 mg | ORAL_TABLET | Freq: Every day | ORAL | Status: DC

## 2012-03-05 MED ORDER — ASPIRIN 325 MG PO TBEC: 325.0000 mg | DELAYED_RELEASE_TABLET | Freq: Every day | ORAL | Status: DC

## 2012-03-05 MED ORDER — LACTULOSE 10 GM/15ML PO SOLN
30.0000 g | Freq: Every day | ORAL | Status: DC | PRN
  Administered 2012-03-05: 30 g via ORAL
  Filled 2012-03-05: qty 45

## 2012-03-05 NOTE — Discharge Summary (Signed)
[address]            [place] [address]          [phone]      Mark Moyer 07/21/1960 51 y.o. [id number]  02/27/2012   Mark Ovens, MD  Dyslipidemia [272.4] NSTEMI (non-ST elevated myocardial infarction) [410.70] Chest pain [786.50] chest pain cp CAD  History of Present Illness:  Mark Moyer is a 51 y.o. male, history of dyslipidemia who does not take medications due to lack of health insurance, who cycles several miles on a weekly basis, however for the last few months he has not cycled as he had an episode of chest discomfort with cycling few months ago. His exercise capacity has declined since then,day of admission after dinner he was sitting on the couch when he had an episode of left-sided chest pressure and sharp pain radiating to his left arm making him sweat nauseated and short of breath, lasted for about half an hour, EMS arrived he received nitroglycerin and aspirin with chest pain relief, patient said that he had irregular heart beat in the ambulance, per EMS he was in atrial fibrillation but converted to sinus rhythm by itself. No Previous history o CAD. No DM . He was admitted for further evaluation and treatment to the hospitalist service with consultation to the [facility] cardiology service.  Current Activity/ Functional Status:  Patient is independent with mobility/ambulation, transfers, ADL's, IADL's.  History reviewed. No pertinent past medical history.  Lactose intolerant  History reviewed. No pertinent past surgical history.  Vasectomy 20 years ago  History   Smoking status   .  Former Smoker -- Quit 22years ago smoked for 15 years   Smokeless tobacco   .  none    History   Alcohol Use    2-3 glasses wine per day  History    Social History   .  Marital Status:  Married     Spouse Name:  Building services engineer     Number of Children:  none   .  Years of Education:  N/A    Occupational History   .  Web  development    Social History Main Topics   .  Smoking status:  Former Smoker -- 20 years   .  Smokeless tobacco:  Not on file   .  Alcohol Use:    .  Drug Use:    .  Sexually Active:    No Known Allergies   Review of Systems: At time of cardiothoracic surgical consultation Cardiac Review of Systems: Y or N  Chest Pain [ y ] Resting SOB [ n ] Exertional SOB [ y ] Orthopnea [ n ]  Pedal Edema [ n ] Palpitations [a fib by ems ] Syncope [n ] Presyncope [n ]  General Review of Systems: [Y] = yes [ ] =no  Constitional: recent weight change [n ]; anorexia [ ] ; fatigue [ ] ; nausea [ ] ; night sweats [ ] ; fever [ ] ; or chills [ ] ;  Dental: poor dentition[ y ]; crown put on tooth two weeks ago  Eye : blurred vision [ ] ; diplopia [ ] ; vision changes [ n ]; Amaurosis fugax[n ];  Resp: cough [ ] ; wheezing[ y; hemoptysis[ ] ; shortness of breath[y ]; paroxysmal nocturnal dyspnea[y ]; dyspnea on exertion[ y]; or orthopnea[ ] ;  GI: gallstones[ n ], vomiting[ ] ; dysphagia[ ] ; melena[ ] ;  hematochezia [ ] ; heartburn[ ] ; Hx of Colonoscopy[ n ];  GU: kidney stones [ ] ; hematuria[ ] ; dysuria [ n]; nocturia[ n ]; history of obstruction [n ];  Skin: rash, swelling[ ] ;, hair loss[ ] ; peripheral edema[ ] ; or itching[ ] ;  Musculosketetal: myalgias[ ] ; joint swelling[ ] ; joint erythema[ ] ;  joint pain[ ] ; back pain[ ] ;  Heme/Lymph: bruising[ ] ; bleeding[ ] ; anemia[ ] ;  Neuro: TIA[ ] ; headaches[ ] ; stroke[ ] ; vertigo[ ] ; seizures[ ] ; paresthesias[ ] ; difficulty walking[n ];  Psych:depression[ ] ; anxiety[ ] ;  Endocrine: diabetes[ ] ; thyroid dysfunction[ ] ;  Immunizations: Flu [ 2 years ]; Pneumococcal[n ];  Other:  Physical Exam: At time of cardiothoracic surgical consultation BP 137/76  Pulse 49  Temp 98.6 F (37 C) (Oral)  Resp 11  Ht 5' 11.5" (1.816 m)  Wt 187 lb 9.8 oz (85.1 kg)  BMI 25.80 kg/m2  SpO2 99%  General appearance: alert, cooperative and appears stated age  Neurologic: intact  Heart: regular  rate and rhythm, S1, S2 normal, no murmur, click, rub or gallop and normal apical impulse  Lungs: clear to auscultation bilaterally and normal percussion bilaterally  Abdomen: soft, non-tender; bowel sounds normal; no masses, no organomegaly  Extremities: extremities normal, atraumatic, no cyanosis or edema, Homans sign is negative, no sign of DVT and no edema, redness or tenderness in the calves or thighs  Wound: cath site rt wrist no hematoma   Rt carotid bruit      Hospital Course: The patient ruled in for NSTEMI and received aspirin. He was also placed on a heparin drip. Cardiology consultation was obtained for further evaluation and baseline laboratories were obtained as well. He was taken to the cardiac catheterization lab on 02/28/2012 in the following findings were noted:  Hemodynamics:  Central Aortic / Mean Pressures: 124/75 mmHg; 96 mmHg  Left Ventricular Pressures / EDP: 120/13 mmHg; 12 mmHg Left Ventriculography:  EF: 50-55 %  Wall Motion: Anteroapical hypokinesis Coronary Anatomy:  Left Main: Large caliber vessel bifurcates into LAD and circumflex. Distal 20-30% stenosis. LAD: Large caliber vessel with proximal 50 and 40% lesions prior to the first diagonal branch. After the first diagonal there is a focal 80% lesion followed by diffuse 30-40% irregularities distally. The vessel then red rash around the apex. There is extensive small septal perforators which provide collaterals to appear to be posterior descending artery and the right.  D1: Small moderate caliber vessel with diffuse luminal irregularities. Left Circumflex: Moderate to large caliber vessel gives rise to very high for first obtuse marginal branch. At this bifurcation there is a 1, 1, 1 bifurcation lesion with roughly 60% stenoses in each segment pre-and post in both vessels. The circumflex flex then continues on to give a second obtuse marginal branch and then into the AV groove where it gives off posterior lateral  branches.  OM1: A moderate to large caliber vessel roughly same size as the circumflex with the ostial 60% of the described in a diffuse luminal irregularities at the courses as a Ramus distribution.  OM 2: This vessel is a moderate caliber vessel the AV groove it bifurcates into even size branches each of which have a roughly 60% lesion in each vessel. RCA: Large caliber dominant vessel with a near ostial 60% lesion to catheter damping. This is followed by a very focal irregular likely culprit lesion of 95%. Then vessel and normalizes for little bit and is diffusely diseased in the midportion. There is one normal size marginal branch. The vessel does  appear bifurcating 2 with appears to be a very small Posterior Descending Artery that seems to be occluded in the midportion.  RPDA: Very small in caliber and appear to be occluded in the midportion. Fills via collaterals to the left and septal perforators.  RPL Sysytem:The Right Posterior AV Groove Branch small moderate caliber vessel diffusely diseased in the distal RCA and in this vessel. There is one major branch and several smaller branches. There major branch of the decent target for bypass.   due to these findings surgical consultation was obtained with Mark Moyer M.D. who evaluated the patient and studies and agreed with recommendations to proceed with coronary artery surgical revascularization. The procedure was scheduled and on 03/01/2012 was taken to the cardiac operating room where he underwent Procedure:  PRE-OPERATIVE DIAGNOSIS: CAD  POST-OPERATIVE DIAGNOSIS: coronary Artery Disease  PROCEDURE: Procedure(s) (LRB):  CORONARY ARTERY BYPASS GRAFTING x7  LIMA TO LAD  SEQUENTIAL SVG TO DX AND RAMUS INTERMEDIATE  SEQUENTIAL SVG TO DISTAL L CX AND PLB  SEQUENTIAL SVG TO ACUTE MARGINAL AND PDA  ENDOSCOPIC SAPHENOUS VEIN HARVEST RIGHT LEG  TRANSESOPHAGEAL ECHOCARDIOGRAM (TEE) (N/A)  SURGEON: Surgeon(s) and Role:  * Mark Ovens, MD -  Primary  PHYSICIAN ASSISTANT: Mark Barrett PA-C  ANESTHESIA: general Tolerated procedure well was taken to the surgical intensive care unit in stable condition  Postoperative hospital course:  The patient has done quite well. He was weaned from the ventilator without difficulty. All routine lines, monitors and drainage devices have been discontinued in the standard fashion. He has an acute expected blood loss anemia which has stabilized. He has some mild postoperative volume overload which has responded well to diuresis. He is tolerating gradually increasing activities using standard protocols. He had some mild constipation which is improving. Incisions are healing well without evidence of infection. He has had no significant postoperative cardiac dysrhythmias. He is a little bradycardic at times and beta blocker has been titrated to downward. Currently his status is felt to be tentatively stable for discharge in the next 24-48 hours pending ongoing reevaluation of his recovery.     Basename 03/04/12 0534 03/03/12 0430  NA 137 140  K 3.4* 3.8  CL 101 105  CO2 26 27  GLUCOSE 97 122*  BUN 12 12  CALCIUM 8.7 8.5    Basename 03/04/12 0534 03/03/12 0430  WBC 12.0* 10.4  HGB 8.5* 8.7*  HCT 24.6* 24.7*  PLT 185 147*   No results found for this basename: INR:2 in the last 72 hours   Discharge Instructions:  The patient is discharged to home with extensive instructions on wound care and progressive ambulation.  They are instructed not to drive or perform any heavy lifting until returning to see the physician in his office.  Discharge Diagnosis:  Dyslipidemia [272.4] NSTEMI (non-ST elevated myocardial infarction) [410.70] Chest pain [786.50] chest pain cp CAD  Secondary Diagnosis: Patient Active Problem List  Diagnosis  . NSTEMI (non-ST elevated myocardial infarction)  . Dyslipidemia  . Atrial fibrillation with rapid ventricular response - converted spontaneously with resoluition  of Chest pain after NTG  . CAD, elective CABG X 7 Aug 14th 2013  . Family history of coronary artery bypass graft  . Postoperative anemia due to acute blood loss   History reviewed. No pertinent past medical history.     Mark Moyer, Mark Moyer  [facility] Medication Instructions [id number]   Printed on:03/05/12 1435  Medication Information  atorvastatin (LIPITOR) 80 MG tablet Take 80 mg by mouth daily.           desoximetasone (TOPICORT) 0.05 % cream Apply 1 application topically 2 (two) times daily.           aspirin EC 325 MG EC tablet Take 1 tablet (325 mg total) by mouth daily.           carvedilol (COREG) 3.125 MG tablet Take 1 tablet (3.125 mg total) by mouth 2 (two) times daily with a meal.           lisinopril (PRINIVIL,ZESTRIL) 2.5 MG tablet Take 1 tablet (2.5 mg total) by mouth daily.           oxyCODONE (OXY IR/ROXICODONE) 5 MG immediate release tablet Take 1-2 tablets (5-10 mg total) by mouth every 4 (four) hours as needed.             Disposition: Discharge home  Patient's condition is Good  Mark Crane, PA-C 03/05/2012  2:35 PM

## 2012-03-05 NOTE — Progress Notes (Signed)
Patient stated that he did not feel like completing his third walk tonight. Will continue to monitor.

## 2012-03-05 NOTE — Progress Notes (Signed)
Patient was successful at having a bowel movement after receiving prn ducolax and lactulose. Will continue to monitor.

## 2012-03-05 NOTE — Progress Notes (Signed)
Removed pacing wires per MD order per hospital protocol. Pacing wires intact upon removal. Patient tolerated well, no bleeding. Patient VSS. Will continue to monitor closely. Patient advised to remain in bed for 1 hour. Lajuana Matte, RN

## 2012-03-05 NOTE — Progress Notes (Signed)
[facility]  DAILY PROGRESS NOTE   Subjective:  Blood pressure is high normal today. B-blocker was increased.  Objective:  Temp:  [99 F (37.2 C)-99.6 F (37.6 C)] 99.3 F (37.4 C) (08/18 0421) Pulse Rate:  [52-68] 64  (08/18 0421) Resp:  [16-18] 18  (08/18 0421) BP: (139-149)/(84-87) 139/84 mmHg (08/18 0421) SpO2:  [94 %-95 %] 94 % (08/18 0421) Weight:  [80.74 kg (178 lb)] 80.74 kg (178 lb) (08/18 0421) Weight change: -1.46 kg (-3 lb 3.5 oz)  Intake/Output from previous day: 08/17 0701 - 08/18 0700 In: 240 [P.O.:240] Out: -   Intake/Output from this shift:    Medications: Current Facility-Administered Medications  Medication Dose Route Frequency Provider Last Rate Last Dose  . 0.9 %  sodium chloride infusion  250 mL Intravenous PRN Delight Ovens, MD      . acetaminophen (TYLENOL) tablet 650 mg  650 mg Oral Q6H PRN Delight Ovens, MD      . aspirin EC tablet 325 mg  325 mg Oral Daily Delight Ovens, MD   325 mg at 03/04/12 1039  . atorvastatin (LIPITOR) tablet 80 mg  80 mg Oral q1800 Leroy Sea, MD   80 mg at 03/04/12 1744  . bisacodyl (DULCOLAX) EC tablet 10 mg  10 mg Oral Daily PRN Delight Ovens, MD   10 mg at 03/04/12 2229   Or  . bisacodyl (DULCOLAX) suppository 10 mg  10 mg Rectal Daily PRN Delight Ovens, MD      . carvedilol (COREG) tablet 3.125 mg  3.125 mg Oral BID WC Wayne E Gold, PA      . docusate sodium (COLACE) capsule 200 mg  200 mg Oral Daily Delight Ovens, MD   200 mg at 03/04/12 1040  . enoxaparin (LOVENOX) injection 40 mg  40 mg Subcutaneous Q24H Delight Ovens, MD   40 mg at 03/04/12 1040  . insulin aspart (novoLOG) injection 0-24 Units  0-24 Units Subcutaneous TID AC & HS Delight Ovens, MD      . lactulose (CHRONULAC) 10 GM/15ML solution 30 g  30 g Oral Daily PRN Rowe Clack, PA      . lisinopril (PRINIVIL,ZESTRIL) tablet 2.5 mg  2.5 mg Oral Daily Delight Ovens, MD   2.5 mg at 03/04/12  1039  . ondansetron (ZOFRAN) tablet 4 mg  4 mg Oral Q6H PRN Delight Ovens, MD       Or  . ondansetron [facility]) injection 4 mg  4 mg Intravenous Q6H PRN Delight Ovens, MD      . oxyCODONE (Oxy IR/ROXICODONE) immediate release tablet 5-10 mg  5-10 mg Oral Q3H PRN Delight Ovens, MD      . pantoprazole (PROTONIX) EC tablet 40 mg  40 mg Oral QAC breakfast Delight Ovens, MD   40 mg at 03/05/12 0756  . potassium chloride SA (K-DUR,KLOR-CON) CR tablet 40 mEq  40 mEq Oral Once Rowe Clack, PA   40 mEq at 03/04/12 1045  . sodium chloride 0.9 % injection 3 mL  3 mL Intravenous Q12H Delight Ovens, MD   3 mL at 03/04/12 2155  . sodium chloride 0.9 % injection 3 mL  3 mL Intravenous PRN Delight Ovens, MD      . traMADol Janean Sark) tablet 50-100 mg  50-100 mg Oral Q4H PRN Delight Ovens, MD   100 mg at 03/05/12 0756  . DISCONTD: carvedilol (  COREG) tablet 6.25 mg  6.25 mg Oral BID WC Mihai Croitoru, MD   6.25 mg at 03/05/12 0756    Physical Exam: General appearance: alert and no distress Neck: no adenopathy, no carotid bruit, no JVD, supple, symmetrical, trachea midline and thyroid not enlarged, symmetric, no tenderness/mass/nodules Lungs: clear to auscultation bilaterally Heart: regular rate and rhythm, S1, S2 normal, no murmur, click, rub or gallop Abdomen: soft, non-tender; bowel sounds normal; no masses,  no organomegaly Extremities: extremities normal, atraumatic, no cyanosis or edema Pulses: 2+ and symmetric  Lab Results: Results for orders placed during the hospital encounter of 02/27/12 (from the past 48 hour(s))  GLUCOSE, CAPILLARY     Status: Abnormal   Collection Time   03/03/12 11:01 AM      Component Value Range Comment   Glucose-Capillary 116 (*) 70 - 99 mg/dL    Comment 1 Notify RN      Comment 2 Documented in Chart     GLUCOSE, CAPILLARY     Status: Abnormal   Collection Time   03/03/12  4:16 PM      Component Value Range Comment   Glucose-Capillary 104 (*)  70 - 99 mg/dL    Comment 1 Documented in Chart      Comment 2 Notify RN     GLUCOSE, CAPILLARY     Status: Abnormal   Collection Time   03/03/12  8:44 PM      Component Value Range Comment   Glucose-Capillary 112 (*) 70 - 99 mg/dL   CBC     Status: Abnormal   Collection Time   03/04/12  5:34 AM      Component Value Range Comment   WBC 12.0 (*) 4.0 - 10.5 K/uL    RBC 2.70 (*) 4.22 - 5.81 MIL/uL    Hemoglobin 8.5 (*) 13.0 - 17.0 g/dL    HCT 09.8 (*) 11.9 - 52.0 %    MCV 91.1  78.0 - 100.0 fL    MCH 31.5  26.0 - 34.0 pg    MCHC 34.6  30.0 - 36.0 g/dL    RDW 14.7  82.9 - 56.2 %    Platelets 185  150 - 400 K/uL   BASIC METABOLIC PANEL     Status: Abnormal   Collection Time   03/04/12  5:34 AM      Component Value Range Comment   Sodium 137  135 - 145 mEq/L    Potassium 3.4 (*) 3.5 - 5.1 mEq/L    Chloride 101  96 - 112 mEq/L    CO2 26  19 - 32 mEq/L    Glucose, Bld 97  70 - 99 mg/dL    BUN 12  6 - 23 mg/dL    Creatinine, Ser 1.30  0.50 - 1.35 mg/dL    Calcium 8.7  8.4 - 86.5 mg/dL    GFR calc non Af Amer >90  >90 mL/min    GFR calc Af Amer >90  >90 mL/min   GLUCOSE, CAPILLARY     Status: Abnormal   Collection Time   03/04/12  6:06 AM      Component Value Range Comment   Glucose-Capillary 104 (*) 70 - 99 mg/dL   GLUCOSE, CAPILLARY     Status: Abnormal   Collection Time   03/04/12 11:27 AM      Component Value Range Comment   Glucose-Capillary 112 (*) 70 - 99 mg/dL   GLUCOSE, CAPILLARY     Status: Abnormal   Collection  Time   03/04/12  4:10 PM      Component Value Range Comment   Glucose-Capillary 119 (*) 70 - 99 mg/dL   GLUCOSE, CAPILLARY     Status: Normal   Collection Time   03/04/12  9:49 PM      Component Value Range Comment   Glucose-Capillary 99  70 - 99 mg/dL   GLUCOSE, CAPILLARY     Status: Normal   Collection Time   03/05/12  5:57 AM      Component Value Range Comment   Glucose-Capillary 94  70 - 99 mg/dL     Imaging: No results  found.  Assessment:  1. Principal Problem: 2.  *NSTEMI (non-ST elevated myocardial infarction) 3. Active Problems: 4.  Dyslipidemia 5.  Atrial fibrillation with rapid ventricular response - converted spontaneously with resoluition of Chest pain after NTG 6.  CAD, elective CABG X 7 Aug 14th 2013 7.  Family history of coronary artery bypass graft 8.  Postoperative anemia due to acute blood loss 9.   Plan:  1. Doing well. Complains of some incisional pain and pain in his shoulder blades. Constipation is the main issue now. HR and BP are controlled. In sinus.  Time Spent Directly with Patient:  15 minutes  Length of Stay:  LOS: 7 days   Chrystie Nose, MD, [facility] Attending Cardiologist [facility]  Estevan Kersh C 03/05/2012, 9:33 AM

## 2012-03-05 NOTE — Progress Notes (Deleted)
Was notified at 0405 that patient was in second degree heart block type 1. Patient was going back and forth between that rhythm and NSR.  Was told in report that it had also occurred on day shift and that the MD was aware. Strip placed in chart. Will continue to monitor.

## 2012-03-05 NOTE — Progress Notes (Addendum)
[address]            [place] [address]          [phone]     4 Days Post-Op  Procedure(s) (LRB): CORONARY ARTERY BYPASS GRAFTING (CABG) (N/A) TRANSESOPHAGEAL ECHOCARDIOGRAM (TEE) (N/A) Subjective: C/o constipation  Objective  Telemetry sinus rhythm and brady with pvc's /couplets  Temp:  [99 F (37.2 C)-99.6 F (37.6 C)] 99.3 F (37.4 C) (08/18 0421) Pulse Rate:  [52-68] 64  (08/18 0421) Resp:  [16-18] 18  (08/18 0421) BP: (139-149)/(84-87) 139/84 mmHg (08/18 0421) SpO2:  [94 %-95 %] 94 % (08/18 0421) Weight:  [178 lb (80.74 kg)] 178 lb (80.74 kg) (08/18 0421)   Intake/Output Summary (Last 24 hours) at 03/05/12 0809 Last data filed at 03/04/12 0830  Gross per 24 hour  Intake    240 ml  Output      0 ml  Net    240 ml       General appearance: alert, cooperative and no distress Heart: regular rate and rhythm and S1, S2 normal Lungs: dim left base Abdomen: soft, + BS, nontender Extremities: no edema Wound: incisions healing well  Lab Results:  Basename 03/04/12 0534 03/03/12 0430 03/02/12 1840  NA 137 140 --  K 3.4* 3.8 --  CL 101 105 --  CO2 26 27 --  GLUCOSE 97 122* --  BUN 12 12 --  CREATININE 0.62 0.72 --  CALCIUM 8.7 8.5 --  MG -- -- 2.1  PHOS -- -- --   No results found for this basename: AST:2,ALT:2,ALKPHOS:2,BILITOT:2,PROT:2,ALBUMIN:2 in the last 72 hours No results found for this basename: LIPASE:2,AMYLASE:2 in the last 72 hours  Basename 03/04/12 0534 03/03/12 0430  WBC 12.0* 10.4  NEUTROABS -- --  HGB 8.5* 8.7*  HCT 24.6* 24.7*  MCV 91.1 90.1  PLT 185 147*   No results found for this basename: CKTOTAL:4,CKMB:4,TROPONINI:4 in the last 72 hours No components found with this basename: POCBNP:3 No results found for this basename: DDIMER in the last 72 hours No results found for this basename: HGBA1C in the last 72 hours No results found for this basename: CHOL,HDL,LDLCALC,TRIG,CHOLHDL in the last 72  hours No results found for this basename: TSH,T4TOTAL,FREET3,T3FREE,THYROIDAB in the last 72 hours No results found for this basename: VITAMINB12,FOLATE,FERRITIN,TIBC,IRON,RETICCTPCT in the last 72 hours  Medications: Scheduled    . aspirin EC  325 mg Oral Daily  . atorvastatin  80 mg Oral q1800  . carvedilol  6.25 mg Oral BID WC  . docusate sodium  200 mg Oral Daily  . enoxaparin (LOVENOX) injection  40 mg Subcutaneous Q24H  . insulin aspart  0-24 Units Subcutaneous TID AC & HS  . lisinopril  2.5 mg Oral Daily  . pantoprazole  40 mg Oral QAC breakfast  . potassium chloride  40 mEq Oral Once  . sodium chloride  3 mL Intravenous Q12H  . DISCONTD: carvedilol  3.125 mg Oral BID WC     Radiology/Studies:  No results found.  INR: Will add last result for INR, ABG once components are confirmed Will add last 4 CBG results once components are confirmed  Assessment/Plan: S/P Procedure(s) (LRB): CORONARY ARTERY BYPASS GRAFTING (CABG) (N/A) TRANSESOPHAGEAL ECHOCARDIOGRAM (TEE) (N/A)  1. Will try lactulose for constipation 2 overall doing well 3 recheck labs on am 4 decrease coreg dose 5 d/c epw's   LOS: 7 days  Mark Moyer,Mark Moyer 8/18/20138:09 AM     Chart reviewed, patient examined, agree with above.

## 2012-03-06 LAB — BASIC METABOLIC PANEL
CO2: 28 mEq/L (ref 19–32)
Chloride: 97 mEq/L (ref 96–112)
Creatinine, Ser: 0.71 mg/dL (ref 0.50–1.35)

## 2012-03-06 LAB — GLUCOSE, CAPILLARY: Glucose-Capillary: 110 mg/dL — ABNORMAL HIGH (ref 70–99)

## 2012-03-06 MED ORDER — LACTASE 9000 UNITS PO CHEW
1.0000 | CHEWABLE_TABLET | Freq: Once | ORAL | Status: AC
  Administered 2012-03-06: 9000 [IU] via ORAL
  Filled 2012-03-06 (×2): qty 1

## 2012-03-06 MED ORDER — POTASSIUM CHLORIDE CRYS ER 20 MEQ PO TBCR: 20.0000 meq | EXTENDED_RELEASE_TABLET | Freq: Every day | ORAL | Status: DC

## 2012-03-06 MED ORDER — TRAMADOL HCL 50 MG PO TABS: 50.0000 mg | ORAL_TABLET | ORAL | Status: AC | PRN

## 2012-03-06 MED ORDER — LACTASE 3000 UNITS PO TABS
6000.0000 [IU] | ORAL_TABLET | Freq: Once | ORAL | Status: DC
  Filled 2012-03-06: qty 2

## 2012-03-06 MED ORDER — POTASSIUM CHLORIDE CRYS ER 20 MEQ PO TBCR
40.0000 meq | EXTENDED_RELEASE_TABLET | Freq: Once | ORAL | Status: AC
  Administered 2012-03-06: 40 meq via ORAL
  Filled 2012-03-06: qty 2

## 2012-03-06 MED ORDER — SIMETHICONE 80 MG PO CHEW
80.0000 mg | CHEWABLE_TABLET | Freq: Four times a day (QID) | ORAL | Status: DC | PRN
  Administered 2012-03-06: 80 mg via ORAL
  Filled 2012-03-06: qty 1

## 2012-03-06 MED FILL — Dexmedetomidine HCl in NaCl 0.9% IV Soln 200 MCG/50ML: INTRAVENOUS | Qty: 50 | Status: AC
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
## 2012-03-06 NOTE — Progress Notes (Signed)
Subjective: No complaints  Objective: Vital signs in last 24 hours: Temp:  [98.1 F (36.7 C)-98.6 F (37 C)] 98.6 F (37 C) (08/19 0415) Pulse Rate:  [55-67] 66  (08/19 0918) Resp:  [18] 18  (08/19 0415) BP: (130-156)/(73-89) 145/89 mmHg (08/19 0918) SpO2:  [93 %] 93 % (08/19 0415) Weight:  [84.414 kg (186 lb 1.6 oz)] 84.414 kg (186 lb 1.6 oz) (08/19 0415) Weight change: 3.674 kg (8 lb 1.6 oz) Last BM Date: 03/05/12 Intake/Output from previous day: -941 08/18 0701 - 08/19 0700 In: 360 [P.O.:360] Out: 1301 [Urine:1300; Stool:1] Intake/Output this shift: Total I/O In: 240 [P.O.:240] Out: 300 [Urine:300]  PE: General:alert and oriented, no complaints, ready for discharge Heart:S1S2 RRR Lungs:clear without rales or wheezes Abd:+ BS, soft, non tender Ext:no edema   Lab Results:  [facility] 03/04/12 0534  WBC 12.0*  HGB 8.5*  HCT 24.6*  PLT 185   BMET  Basename 03/06/12 0510 03/04/12 0534  NA 137 137  K 3.3* 3.4*  CL 97 101  CO2 28 26  GLUCOSE 93 97  BUN 11 12  CREATININE 0.71 0.62  CALCIUM 9.4 8.7   No results found for this basename: TROPONINI:2,CK,MB:2 in the last 72 hours  Lab Results  Component Value Date   CHOL 259* 02/28/2012   HDL 43 02/28/2012   LDLCALC 177* 02/28/2012   TRIG 194* 02/28/2012   CHOLHDL 6.0 02/28/2012   Lab Results  Component Value Date   HGBA1C 5.6 02/29/2012     Lab Results  Component Value Date   TSH 4.061 02/28/2012   Studies/Results: No results found.  Medications: I have reviewed the patient's current medications.    Marland Kitchen aspirin EC  325 mg Oral Daily  . atorvastatin  80 mg Oral q1800  . carvedilol  3.125 mg Oral BID WC  . docusate sodium  200 mg Oral Daily  . enoxaparin (LOVENOX) injection  40 mg Subcutaneous Q24H  . insulin aspart  0-24 Units Subcutaneous TID AC & HS  . Lactase  1 tablet Oral Once  . lisinopril  2.5 mg Oral Daily  . pantoprazole  40 mg Oral QAC breakfast  . potassium chloride  40 mEq Oral Once  .  sodium chloride  3 mL Intravenous Q12H  . DISCONTD: lactase  6,000 Units Oral Once   Assessment/Plan: Principal Problem:  *NSTEMI (non-ST elevated myocardial infarction) Active Problems:  Dyslipidemia  Atrial fibrillation with rapid ventricular response - converted spontaneously with resoluition of Chest pain after NTG  CAD, elective CABG X 7 Aug 14th 2013  Family history of coronary artery bypass graft  Postoperative anemia due to acute blood loss  PLAN: Pt. For discharge home, he does have occ. PVC.  EF 45-50 %.  Ambulates without problems.  LOS: 8 days   INGOLD,LAURA R 03/06/2012, 10:31 AM  Unfortunately, the patient was discharged prior to my seeing him.   He should be scheduled for Cardiology F/u with me on d/c.   He was otherwise stable from a cardiology standpoint per Dr. Rennis Golden yesterday.  No further Afib & on stable dose of BB & ACE-I along with statin & ASA.  I discussed him with Ms. Ingold, but was unable to get to his room prior to his leaving.  Marykay Lex, M.D., M.S. [facility] [address] [place], [place]  [address]  [phone] Pager # [phone]  03/06/2012 4:02 PM

## 2012-03-06 NOTE — Progress Notes (Signed)
Chest tube sutures removed intact with no redness, bleeding or s/s of infection noted. Cleaned with Tincture Benzoin and steri strips applied. Mark Moyer

## 2012-03-06 NOTE — Progress Notes (Signed)
5 Days Post-Op Procedure(s) (LRB): CORONARY ARTERY BYPASS GRAFTING (CABG) (N/A) TRANSESOPHAGEAL ECHOCARDIOGRAM (TEE) (N/A)  Subjective: Mr. deese complains of gas this morning.  States he is lactose intolerant and took lactulose yesterday.  He did have a bowel movement.  Objective: Vital signs in last 24 hours: Temp:  [98.1 F (36.7 C)-98.6 F (37 C)] 98.6 F (37 C) (08/19 0415) Pulse Rate:  [55-67] 61  (08/19 0415) Cardiac Rhythm:  [-] Normal sinus rhythm (08/18 2200) Resp:  [18] 18  (08/19 0415) BP: (130-156)/(67-83) 136/78 mmHg (08/19 0415) SpO2:  [93 %] 93 % (08/19 0415) Weight:  [186 lb 1.6 oz (84.414 kg)] 186 lb 1.6 oz (84.414 kg) (08/19 0415)  Intake/Output from previous day: 08/18 0701 - 08/19 0700 In: 360 [P.O.:360] Out: 1301 [Urine:1300; Stool:1]  General appearance: alert, cooperative and no distress Neurologic: intact Heart: regular rate and rhythm Lungs: clear to auscultation bilaterally Abdomen: soft, non-tender; bowel sounds normal; no masses,  no organomegaly Extremities: edema trace Wound: clean and dry  Lab Results:  [facility] 03/04/12 0534  WBC 12.0*  HGB 8.5*  HCT 24.6*  PLT 185   BMET:  Basename 03/06/12 0510 03/04/12 0534  NA 137 137  K 3.3* 3.4*  CL 97 101  CO2 28 26  GLUCOSE 93 97  BUN 11 12  CREATININE 0.71 0.62  CALCIUM 9.4 8.7    PT/INR: No results found for this basename: LABPROT,INR in the last 72 hours ABG    Component Value Date/Time   PHART 7.347* 03/01/2012 2116   HCO3 25.5* 03/01/2012 2116   TCO2 23 03/02/2012 1826   ACIDBASEDEF 1.0 03/01/2012 2116   O2SAT 97.0 03/01/2012 2116   CBG (last 3)   Basename 03/05/12 2106 03/05/12 1619 03/05/12 1112  GLUCAP 110* 111* 97    Assessment/Plan: S/P Procedure(s) (LRB): CORONARY ARTERY BYPASS GRAFTING (CABG) (N/A) TRANSESOPHAGEAL ECHOCARDIOGRAM (TEE) (N/A)  1. CV- NSR on Coreg and Lisinopril 2. Abdominal Gas- due to lactose intolerance, will give Lactaid and Simethicone 3.  Pulm- no acute issues 4. Hypokalemia- will give potassium supplementation 5. Dispo- patient doing well, will give potassium supplementation today and 2 days worth at discharge, will d/c home today   LOS: 8 days    Raford Pitcher, [facility] 03/06/2012

## 2012-03-06 NOTE — Progress Notes (Signed)
Patient transported to lobby via wheelchair per volunteer transporter. Belongings with patient and family. Mark Moyer

## 2012-03-06 NOTE — Progress Notes (Signed)
Discharge instructions provided along with med list and scripts provided. Follow up appointments discussed with patient and family. Patient stated his could not tolerate Oxycodone for d/c; requested Tramadol. Mark Dandy, PA notified of patient request. Mark Moyer

## 2012-03-06 NOTE — Progress Notes (Signed)
[id number] Education completed with pt and wife. Permission given to refer to [facility]. Put on d/c video for pt to watch. States he walked without safety device yesterday.Mahdiya Mossberg DunlapRN

## 2012-03-24 ENCOUNTER — Other Ambulatory Visit: Payer: Self-pay | Admitting: Cardiothoracic Surgery

## 2012-03-24 DIAGNOSIS — Z951 Presence of aortocoronary bypass graft: Secondary | ICD-10-CM

## 2012-03-27 ENCOUNTER — Ambulatory Visit
Admission: RE | Admit: 2012-03-27 | Discharge: 2012-03-27 | Disposition: A | Discharge status: Home / Self Care | Payer: No Typology Code available for payment source | Source: Ambulatory Visit | Attending: Cardiothoracic Surgery | Admitting: Cardiothoracic Surgery

## 2012-03-27 ENCOUNTER — Ambulatory Visit (INDEPENDENT_AMBULATORY_CARE_PROVIDER_SITE_OTHER): Payer: Self-pay | Admitting: Surgical

## 2012-03-27 ENCOUNTER — Ambulatory Visit: Payer: Self-pay

## 2012-03-27 VITALS — BP 111/75 | HR 54 | Resp 18 | Ht 72.0 in | Wt 178.0 lb

## 2012-03-27 DIAGNOSIS — Z951 Presence of aortocoronary bypass graft: Secondary | ICD-10-CM

## 2012-03-27 DIAGNOSIS — I2510 Atherosclerotic heart disease of native coronary artery without angina pectoris: Secondary | ICD-10-CM

## 2012-03-27 NOTE — Patient Instructions (Signed)
Patient was given verbal instructions which she understands regarding ongoing rehabilitation modalities including lifting and driving.

## 2012-03-27 NOTE — Progress Notes (Signed)
[address]            [place] [address]          [phone]                        [address]            [place] [address]          [phone]       Dillinger A[facility] Medical Record #[id number] Date of Birth: [birth year]  Marykay Lex, MD DEFAULT,PROVIDER, MD  Chief Complaint:   PostOp Follow Up Visit   History of Present Illness:    Seen in the office in routine followup after his coronary artery bypass grafting x7 on 03/01/2012 by Dr. Tyrone Sage. He is only having some mild discomfort. He is not using any analgesics at this time. He denies shortness of breath, chest pain, fevers/chills or other constitutional symptoms.           History  Smoking status  . Former Smoker -- 20 years  Smokeless tobacco  . Not on file       No Known Allergies  Current Outpatient Prescriptions  Medication Sig Dispense Refill  . aspirin EC 325 MG EC tablet Take 1 tablet (325 mg total) by mouth daily.  30 tablet    . atorvastatin (LIPITOR) 80 MG tablet Take 80 mg by mouth daily.      . carvedilol (COREG) 3.125 MG tablet Take 1 tablet (3.125 mg total) by mouth 2 (two) times daily with a meal.  60 tablet  1  . desoximetasone (TOPICORT) 0.05 % cream Apply 1 application topically 2 (two) times daily.      Marland Kitchen lisinopril (PRINIVIL,ZESTRIL) 2.5 MG tablet Take 1 tablet (2.5 mg total) by mouth daily.  30 tablet  1       Physical Exam: BP 111/75  Pulse 54  Resp 18  Ht 6' (1.829 m)  Wt 178 lb (80.74 kg)  BMI 24.14 kg/m2  SpO2 98%  General appearance: alert, cooperative and no distress Heart: regular rate and rhythm and S1, S2 normal Lungs: clear to auscultation bilaterally Abdomen: Benign exam Extremities: Trace edema bilateral lower extremities Wound: Incisions healing well without evidence of infection   Diagnostic Studies & Laboratory data:         Recent Radiology Findings: Dg Chest 2 View  03/27/2012   *RADIOLOGY REPORT*  Clinical Data: Follow up of CABG 4 weeks ago.  Hypertension.  CHEST - 2 VIEW  Comparison: 03/03/2012  Findings: Mild hyperinflation. Prior median sternotomy. Midline trachea.  Normal heart size and mediastinal contours.  Small pleural effusion is similar, given differences in technique. No pneumothorax.  No congestive failure.  Improved left base aeration.  The right lung is clear.  IMPRESSION: Small left pleural effusion remains.  Otherwise, no acute disease.   Original Report Authenticated By: Consuello Bossier, M.D.       Recent Labs: Lab Results  Component Value Date   WBC 12.0* 03/04/2012   HGB 8.5* 03/04/2012   HCT 24.6* 03/04/2012   PLT 185 03/04/2012   GLUCOSE 93 03/06/2012   CHOL 259* 02/28/2012   TRIG 194* 02/28/2012   HDL 43 02/28/2012   LDLCALC 177* 02/28/2012   ALT 12 02/29/2012   AST 46* 02/29/2012   NA 137 03/06/2012  K 3.3* 03/06/2012   CL 97 03/06/2012   CREATININE 0.71 03/06/2012   BUN 11 03/06/2012   CO2 28 03/06/2012   TSH 4.061 02/28/2012   INR 1.37 03/01/2012   HGBA1C 5.6 02/29/2012      Assessment / Plan:  Doing quite well. He is scheduled to see cardiology tomorrow. We will see again on a when necessary basis for any surgically related issues or at request. He is instructed on ongoing lifting restrictions as well as driving protocols.          Rudy Luhmann E 03/27/2012 3:45 PM

## 2012-03-28 ENCOUNTER — Encounter: Payer: Self-pay | Admitting: Cardiothoracic Surgery

## 2012-06-26 ENCOUNTER — Other Ambulatory Visit (HOSPITAL_COMMUNITY): Payer: Self-pay | Admitting: Cardiology

## 2012-06-26 DIAGNOSIS — I219 Acute myocardial infarction, unspecified: Secondary | ICD-10-CM

## 2012-06-26 DIAGNOSIS — Z951 Presence of aortocoronary bypass graft: Secondary | ICD-10-CM

## 2012-07-06 ENCOUNTER — Ambulatory Visit (HOSPITAL_COMMUNITY): Payer: Self-pay

## 2013-04-30 ENCOUNTER — Other Ambulatory Visit: Payer: Self-pay | Admitting: *Deleted

## 2013-04-30 MED ORDER — LISINOPRIL 2.5 MG PO TABS: ORAL_TABLET | ORAL | Status: DC

## 2013-04-30 NOTE — Telephone Encounter (Signed)
Rx was sent to pharmacy electronically. 

## 2013-05-02 MED ORDER — LISINOPRIL 2.5 MG PO TABS: ORAL_TABLET | ORAL | Status: DC

## 2013-05-02 NOTE — Telephone Encounter (Signed)
Rx printed. Resent Rx to pharmacy electronically.

## 2013-05-02 NOTE — Addendum Note (Signed)
Addended by: Neta Ehlers on: 05/02/2013 05:36 PM   Modules accepted: Orders

## 2013-07-19 IMAGING — CR DG CHEST 1V PORT
1 series · 1 of 1 positions shown · non-contrast
Comparison: None.

CLINICAL DATA: Return respiratory films.  Chest pain.

PORTABLE CHEST - 1 VIEW

[AP]
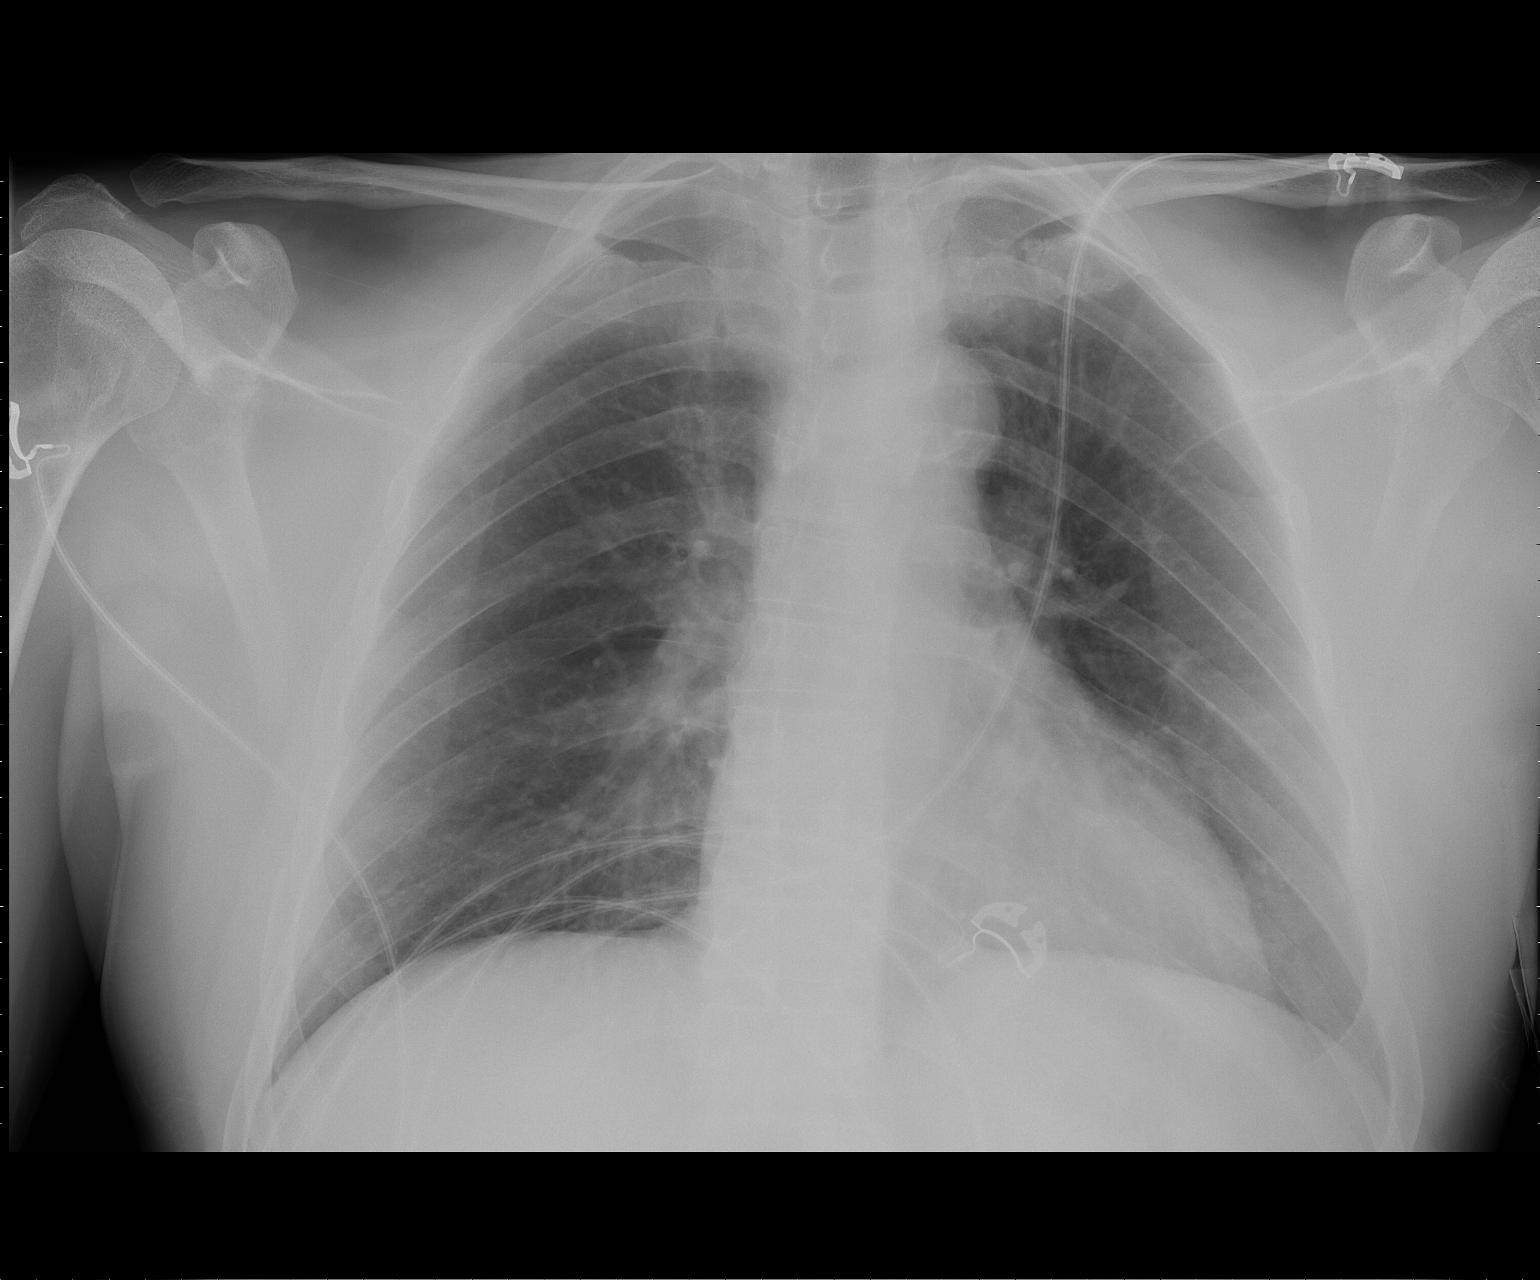

[1 of 1 positions shown; findings below may reference images not displayed]

FINDINGS: The lungs are clear.  Heart size is normal.  No
pneumothorax or pleural fluid.
IMPRESSION: Negative chest.

## 2013-07-21 IMAGING — CR DG CHEST 1V PORT
1 series · 1 of 1 positions shown · non-contrast
Comparison: Portable chest x-ray of 03/01/2012

CLINICAL DATA: Postop CABG

PORTABLE CHEST - 1 VIEW

[AP]
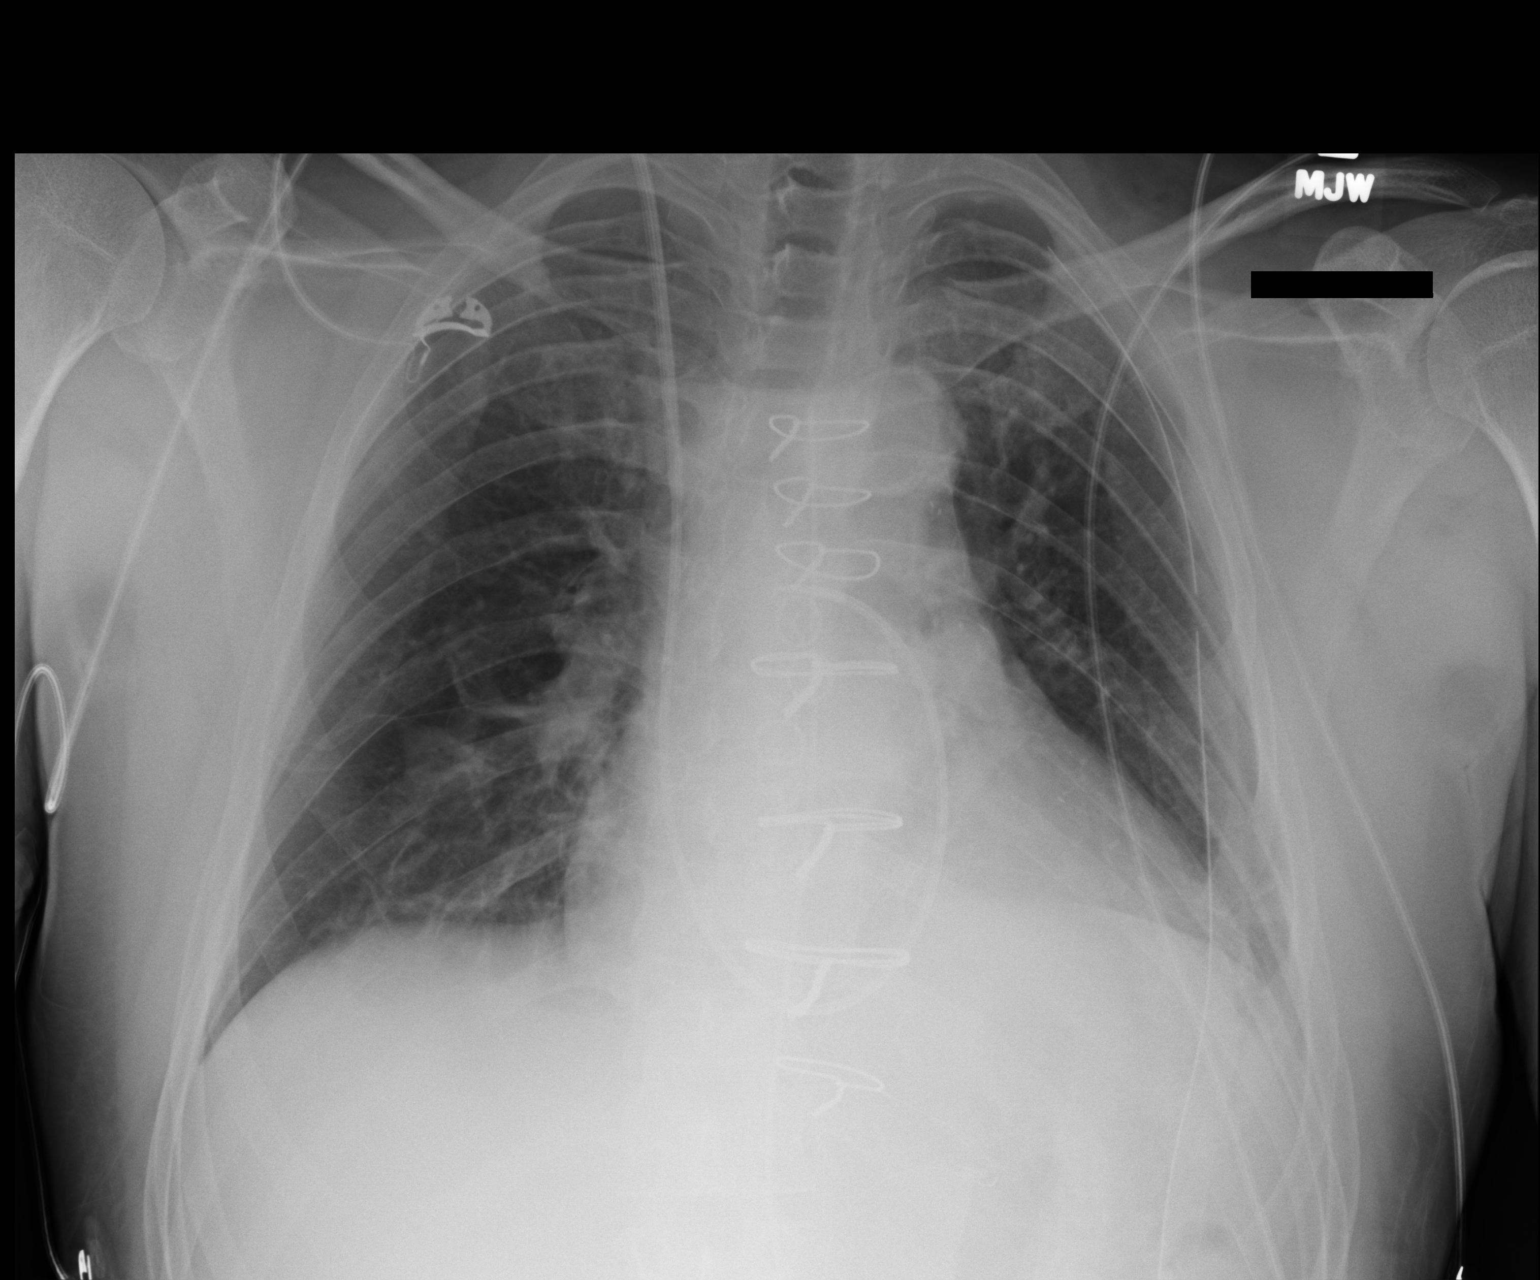

[1 of 1 positions shown; findings below may reference images not displayed]

FINDINGS: The endotracheal tube has been removed.  There is little
change in aeration with only mild basilar volume loss present.  No
pneumothorax is seen, and left chest tube remains.  Cardiomegaly
stable.  Swan-Ganz catheter tip is unchanged in position.
IMPRESSION: Endotracheal tube removed.  Slightly better aeration with only
minimal basilar atelectasis.

## 2013-07-22 IMAGING — CR DG CHEST 1V PORT
1 series · 1 of 1 positions shown · non-contrast
Comparison: 03/02/2012; 03/01/2012; 02/29/2012

CLINICAL DATA: Postoperative examination (CABG)

PORTABLE CHEST - 1 VIEW

[AP]
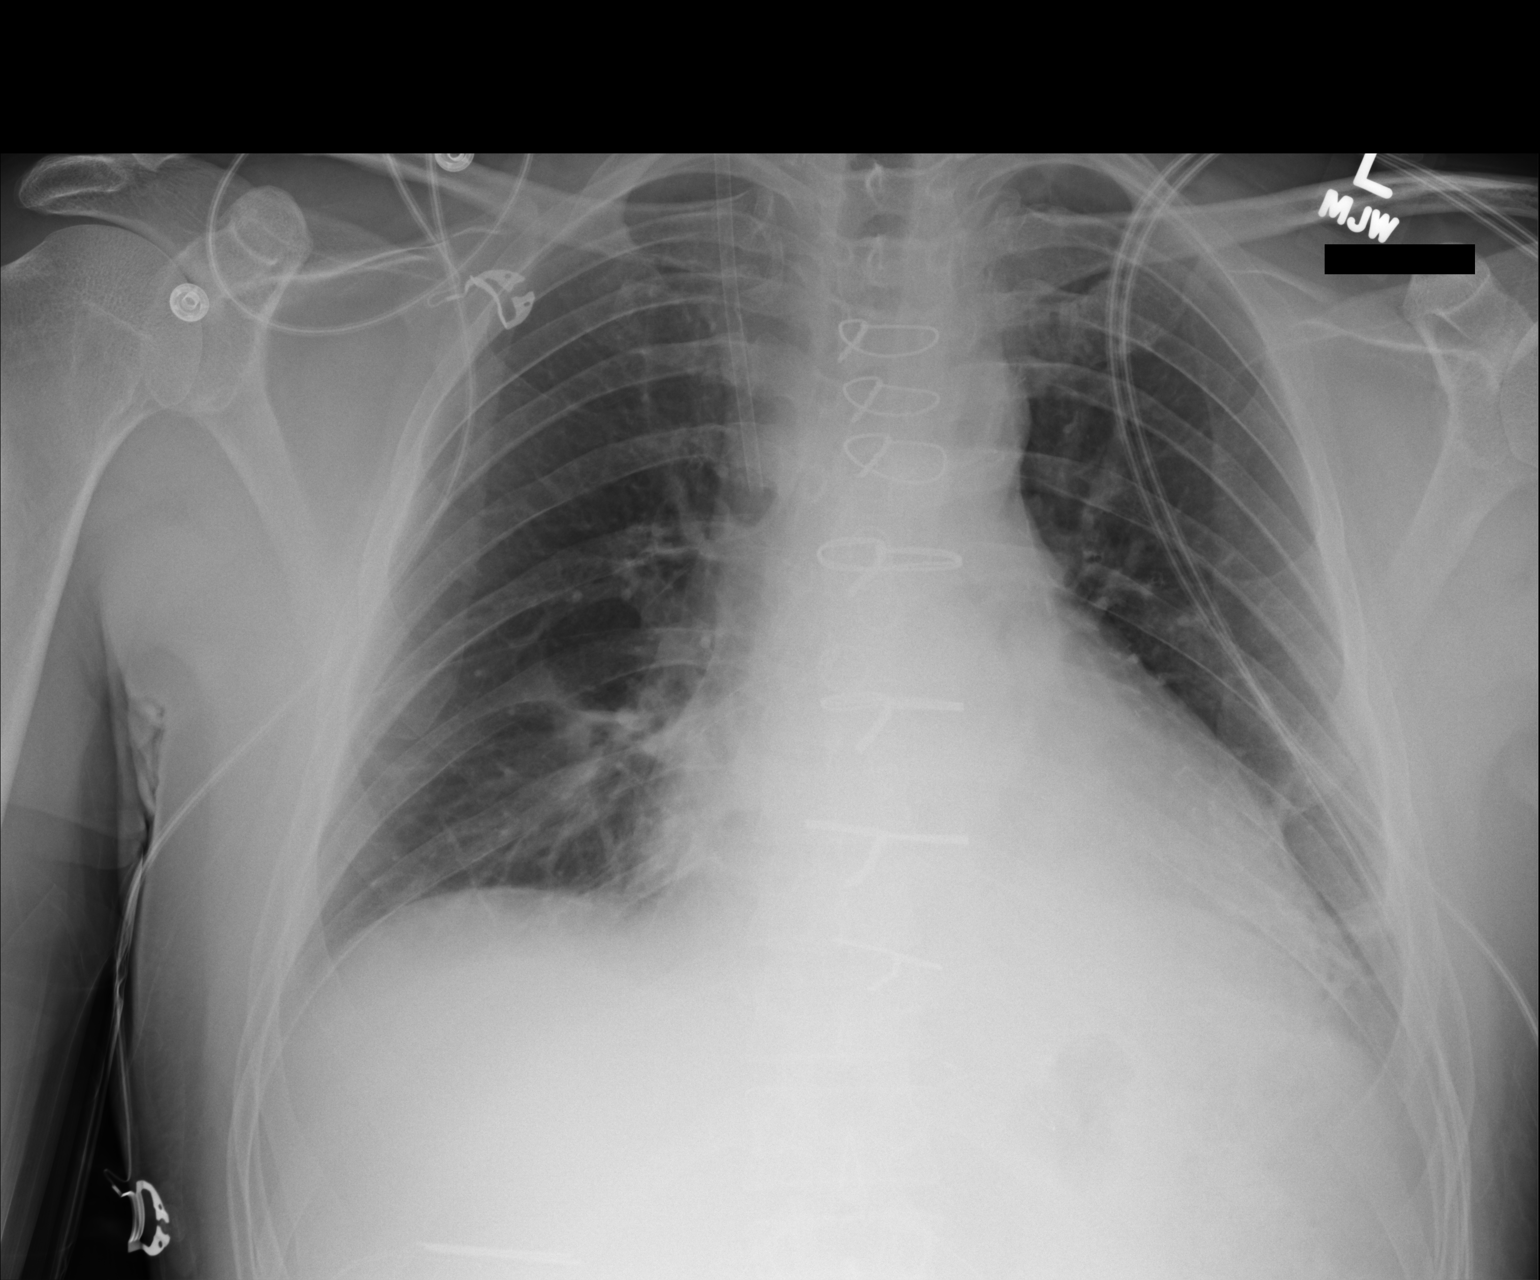

[1 of 1 positions shown; findings below may reference images not displayed]

FINDINGS: Grossly unchanged cardiac silhouette and mediastinal contours post
median sternotomy and CABG.  Interval removal of right jugular
approach PA catheter with the remaining vascular sheath tip
projecting over the mid SVC.  Interval removal of left-sided chest
tube and mediastinal drain.  No pneumothorax.  Minimal bibasilar
heterogeneous opacities, left greater than right, favored to
represent atelectasis.  No new focal airspace opacities. Trace
bilateral pleural effusions are suspected.  Unchanged bones.
IMPRESSION: 1.  Interval removal of support apparatus as above.  No
pneumothorax.
2.  Trace bilateral effusions and bibasilar opacities, left greater
than right, favored to represent atelectasis.

## 2014-06-27 ENCOUNTER — Encounter (HOSPITAL_COMMUNITY): Payer: Self-pay | Admitting: Cardiology

## 2015-02-25 ENCOUNTER — Encounter: Payer: Self-pay | Admitting: Cardiology

## 2016-12-18 ENCOUNTER — Encounter (HOSPITAL_COMMUNITY): Payer: Self-pay | Admitting: Emergency Medicine

## 2016-12-18 ENCOUNTER — Emergency Department (HOSPITAL_COMMUNITY): Payer: BLUE CROSS/BLUE SHIELD

## 2016-12-18 ENCOUNTER — Observation Stay (HOSPITAL_COMMUNITY)
Admission: EM | Admit: 2016-12-18 | Discharge: 2016-12-19 | Disposition: A | Discharge status: Home / Self Care | Payer: BLUE CROSS/BLUE SHIELD | Attending: Internal Medicine | Admitting: Internal Medicine

## 2016-12-18 DIAGNOSIS — I4891 Unspecified atrial fibrillation: Secondary | ICD-10-CM | POA: Diagnosis present

## 2016-12-18 DIAGNOSIS — E785 Hyperlipidemia, unspecified: Secondary | ICD-10-CM | POA: Insufficient documentation

## 2016-12-18 DIAGNOSIS — Z87891 Personal history of nicotine dependence: Secondary | ICD-10-CM | POA: Diagnosis not present

## 2016-12-18 DIAGNOSIS — S0081XA Abrasion of other part of head, initial encounter: Secondary | ICD-10-CM | POA: Insufficient documentation

## 2016-12-18 DIAGNOSIS — S80212A Abrasion, left knee, initial encounter: Secondary | ICD-10-CM | POA: Diagnosis not present

## 2016-12-18 DIAGNOSIS — Z951 Presence of aortocoronary bypass graft: Secondary | ICD-10-CM | POA: Insufficient documentation

## 2016-12-18 DIAGNOSIS — S80211A Abrasion, right knee, initial encounter: Secondary | ICD-10-CM | POA: Insufficient documentation

## 2016-12-18 DIAGNOSIS — Z7982 Long term (current) use of aspirin: Secondary | ICD-10-CM | POA: Diagnosis not present

## 2016-12-18 DIAGNOSIS — S43101A Unspecified dislocation of right acromioclavicular joint, initial encounter: Secondary | ICD-10-CM | POA: Diagnosis not present

## 2016-12-18 DIAGNOSIS — S43004A Unspecified dislocation of right shoulder joint, initial encounter: Secondary | ICD-10-CM | POA: Diagnosis present

## 2016-12-18 DIAGNOSIS — Y9355 Activity, bike riding: Secondary | ICD-10-CM | POA: Diagnosis not present

## 2016-12-18 DIAGNOSIS — S32501A Unspecified fracture of right pubis, initial encounter for closed fracture: Principal | ICD-10-CM | POA: Insufficient documentation

## 2016-12-18 DIAGNOSIS — I252 Old myocardial infarction: Secondary | ICD-10-CM | POA: Diagnosis not present

## 2016-12-18 DIAGNOSIS — S32591A Other specified fracture of right pubis, initial encounter for closed fracture: Secondary | ICD-10-CM | POA: Diagnosis not present

## 2016-12-18 DIAGNOSIS — S30810A Abrasion of lower back and pelvis, initial encounter: Secondary | ICD-10-CM | POA: Insufficient documentation

## 2016-12-18 DIAGNOSIS — I2510 Atherosclerotic heart disease of native coronary artery without angina pectoris: Secondary | ICD-10-CM | POA: Diagnosis not present

## 2016-12-18 MED ORDER — ASPIRIN EC 81 MG PO TBEC
81.0000 mg | DELAYED_RELEASE_TABLET | Freq: Every day | ORAL | Status: DC
  Administered 2016-12-18: 81 mg via ORAL
  Filled 2016-12-18: qty 1

## 2016-12-18 MED ORDER — MORPHINE SULFATE (PF) 4 MG/ML IV SOLN
4.0000 mg | Freq: Once | INTRAVENOUS | Status: AC
  Administered 2016-12-18: 4 mg via INTRAVENOUS
  Filled 2016-12-18: qty 1

## 2016-12-18 MED ORDER — LOSARTAN POTASSIUM 50 MG PO TABS
100.0000 mg | ORAL_TABLET | Freq: Every day | ORAL | Status: DC
  Administered 2016-12-18: 100 mg via ORAL
  Filled 2016-12-18: qty 2

## 2016-12-18 MED ORDER — HYDROCODONE-ACETAMINOPHEN 5-325 MG PO TABS
1.0000 | ORAL_TABLET | ORAL | Status: DC | PRN
  Administered 2016-12-18 – 2016-12-19 (×4): 2 via ORAL
  Filled 2016-12-18 (×4): qty 2

## 2016-12-18 MED ORDER — ONDANSETRON HCL 4 MG/2ML IJ SOLN
4.0000 mg | Freq: Once | INTRAMUSCULAR | Status: AC
  Administered 2016-12-18: 4 mg via INTRAVENOUS
  Filled 2016-12-18: qty 2

## 2016-12-18 MED ORDER — ACETAMINOPHEN 650 MG RE SUPP: 650.0000 mg | Freq: Four times a day (QID) | RECTAL | Status: DC | PRN

## 2016-12-18 MED ORDER — FENTANYL CITRATE (PF) 100 MCG/2ML IJ SOLN
50.0000 ug | Freq: Once | INTRAMUSCULAR | Status: AC
  Administered 2016-12-18: 50 ug via INTRAVENOUS
  Filled 2016-12-18: qty 2

## 2016-12-18 MED ORDER — ATORVASTATIN CALCIUM 80 MG PO TABS
80.0000 mg | ORAL_TABLET | Freq: Every day | ORAL | Status: DC
  Administered 2016-12-18: 80 mg via ORAL
  Filled 2016-12-18: qty 1

## 2016-12-18 MED ORDER — ONDANSETRON HCL 4 MG/2ML IJ SOLN: 4.0000 mg | Freq: Four times a day (QID) | INTRAMUSCULAR | Status: DC | PRN

## 2016-12-18 MED ORDER — ENOXAPARIN SODIUM 40 MG/0.4ML ~~LOC~~ SOLN
40.0000 mg | SUBCUTANEOUS | Status: DC
  Administered 2016-12-18: 40 mg via SUBCUTANEOUS
  Filled 2016-12-18: qty 0.4

## 2016-12-18 MED ORDER — ACETAMINOPHEN 325 MG PO TABS: 650.0000 mg | ORAL_TABLET | Freq: Four times a day (QID) | ORAL | Status: DC | PRN

## 2016-12-18 MED ORDER — AMLODIPINE BESYLATE 5 MG PO TABS
5.0000 mg | ORAL_TABLET | Freq: Every day | ORAL | Status: DC
  Administered 2016-12-18: 5 mg via ORAL
  Filled 2016-12-18: qty 1

## 2016-12-18 NOTE — ED Notes (Signed)
Patient transported to X-ray, transporter will attempt to take pt to CT.

## 2016-12-18 NOTE — ED Notes (Signed)
Pt refused blood draw for HIV,   Notified nurse and MD

## 2016-12-18 NOTE — ED Notes (Signed)
ED Provider at bedside. 

## 2016-12-18 NOTE — ED Notes (Signed)
Attempted IV X2, left hand and left forearm.
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
## 2016-12-18 NOTE — ED Notes (Signed)
Attempted to ambulated pt again, pt still unable to bare weight on right leg.

## 2016-12-18 NOTE — ED Notes (Signed)
[facility] Pt unable to bear weight on right leg to walk. Pt able to get out of bed with out assistance. Able to stand with assistance.

## 2016-12-18 NOTE — Progress Notes (Signed)
Orthopedic Tech Progress Note Patient Details:  Kallie LocksRonald Panetta 09/24/1960 [id number]  Ortho Devices Type of Ortho Device: Sling immobilizer Ortho Device/Splint Interventions: Application   Saul FordyceJennifer C Giani Betzold 12/18/2016, 6:01 PM
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
## 2016-12-18 NOTE — ED Notes (Signed)
Attempted report, gave RN phone number to call back.
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
## 2016-12-18 NOTE — ED Notes (Signed)
Patient transported to CT 

## 2016-12-19 DIAGNOSIS — E785 Hyperlipidemia, unspecified: Secondary | ICD-10-CM | POA: Diagnosis not present

## 2016-12-19 DIAGNOSIS — S32591A Other specified fracture of right pubis, initial encounter for closed fracture: Secondary | ICD-10-CM

## 2016-12-19 DIAGNOSIS — I4891 Unspecified atrial fibrillation: Secondary | ICD-10-CM

## 2016-12-19 DIAGNOSIS — S43101A Unspecified dislocation of right acromioclavicular joint, initial encounter: Secondary | ICD-10-CM

## 2016-12-19 LAB — CBC WITH DIFFERENTIAL/PLATELET
Basophils Absolute: 0 10*3/uL (ref 0.0–0.1)
Basophils Relative: 1 %
EOS ABS: 0.2 10*3/uL (ref 0.0–0.7)
EOS PCT: 2 %
HCT: 42.1 % (ref 39.0–52.0)
Hemoglobin: 13.8 g/dL (ref 13.0–17.0)
LYMPHS ABS: 0.7 10*3/uL (ref 0.7–4.0)
LYMPHS PCT: 8 %
MCH: 31.2 pg (ref 26.0–34.0)
MCHC: 32.8 g/dL (ref 30.0–36.0)
MCV: 95 fL (ref 78.0–100.0)
MONO ABS: 0.7 10*3/uL (ref 0.1–1.0)
MONOS PCT: 7 %
Neutro Abs: 7.4 10*3/uL (ref 1.7–7.7)
Neutrophils Relative %: 82 %
PLATELETS: 235 10*3/uL (ref 150–400)
RBC: 4.43 MIL/uL (ref 4.22–5.81)
RDW: 12.9 % (ref 11.5–15.5)
WBC: 8.9 10*3/uL (ref 4.0–10.5)

## 2016-12-19 LAB — COMPREHENSIVE METABOLIC PANEL
ALT: 52 U/L (ref 17–63)
ANION GAP: 8 (ref 5–15)
AST: 78 U/L — ABNORMAL HIGH (ref 15–41)
Albumin: 4 g/dL (ref 3.5–5.0)
Alkaline Phosphatase: 86 U/L (ref 38–126)
BUN: 9 mg/dL (ref 6–20)
CALCIUM: 9 mg/dL (ref 8.9–10.3)
CHLORIDE: 104 mmol/L (ref 101–111)
CO2: 25 mmol/L (ref 22–32)
CREATININE: 0.89 mg/dL (ref 0.61–1.24)
Glucose, Bld: 116 mg/dL — ABNORMAL HIGH (ref 65–99)
Potassium: 3.8 mmol/L (ref 3.5–5.1)
SODIUM: 137 mmol/L (ref 135–145)
Total Bilirubin: 2.1 mg/dL — ABNORMAL HIGH (ref 0.3–1.2)
Total Protein: 7.1 g/dL (ref 6.5–8.1)

## 2016-12-19 LAB — MAGNESIUM: MAGNESIUM: 2.1 mg/dL (ref 1.7–2.4)

## 2016-12-19 MED ORDER — ACETAMINOPHEN 500 MG PO TABS: 500.0000 mg | ORAL_TABLET | Freq: Three times a day (TID) | ORAL | 0 refills | Status: AC

## 2016-12-19 MED ORDER — OXYCODONE HCL 5 MG PO TABS: 5.0000 mg | ORAL_TABLET | Freq: Three times a day (TID) | ORAL | 0 refills | Status: AC | PRN

## 2016-12-19 MED ORDER — POLYETHYLENE GLYCOL 3350 17 G PO PACK: 17.0000 g | PACK | Freq: Every day | ORAL | 0 refills | Status: AC | PRN

## 2016-12-19 MED ORDER — SENNOSIDES-DOCUSATE SODIUM 8.6-50 MG PO TABS: 1.0000 | ORAL_TABLET | Freq: Two times a day (BID) | ORAL | Status: AC
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
## 2016-12-19 NOTE — Progress Notes (Signed)
Patient discharged home per MD. Order received for outpatient PT/OT, and DME for wheelchair and 3 in 1. Discharge instructions reviewed with patient and spouse. NSL removed with catheter intact. Patient escorted out via wheelchair and RN. Bess KindsGWALTNEY, Tangy Drozdowski B, RN
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
## 2016-12-28 ENCOUNTER — Ambulatory Visit (INDEPENDENT_AMBULATORY_CARE_PROVIDER_SITE_OTHER): Payer: BLUE CROSS/BLUE SHIELD | Admitting: Physician Assistant

## 2016-12-28 DIAGNOSIS — S32591A Other specified fracture of right pubis, initial encounter for closed fracture: Secondary | ICD-10-CM | POA: Diagnosis not present

## 2016-12-28 DIAGNOSIS — S43101A Unspecified dislocation of right acromioclavicular joint, initial encounter: Secondary | ICD-10-CM
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
## 2017-01-12 ENCOUNTER — Ambulatory Visit (INDEPENDENT_AMBULATORY_CARE_PROVIDER_SITE_OTHER): Payer: BLUE CROSS/BLUE SHIELD

## 2017-01-12 ENCOUNTER — Ambulatory Visit (INDEPENDENT_AMBULATORY_CARE_PROVIDER_SITE_OTHER): Payer: BLUE CROSS/BLUE SHIELD | Admitting: Orthopaedic Surgery

## 2017-01-12 DIAGNOSIS — S32591D Other specified fracture of right pubis, subsequent encounter for fracture with routine healing: Secondary | ICD-10-CM | POA: Diagnosis not present

## 2017-01-12 DIAGNOSIS — S43101A Unspecified dislocation of right acromioclavicular joint, initial encounter: Secondary | ICD-10-CM

## 2017-01-12 DIAGNOSIS — S32592D Other specified fracture of left pubis, subsequent encounter for fracture with routine healing: Secondary | ICD-10-CM | POA: Diagnosis not present
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
## 2017-01-13 ENCOUNTER — Ambulatory Visit (INDEPENDENT_AMBULATORY_CARE_PROVIDER_SITE_OTHER): Payer: BLUE CROSS/BLUE SHIELD | Admitting: Physician Assistant

## 2017-05-10 DIAGNOSIS — E785 Hyperlipidemia, unspecified: Secondary | ICD-10-CM | POA: Diagnosis not present

## 2017-05-12 DIAGNOSIS — I10 Essential (primary) hypertension: Secondary | ICD-10-CM | POA: Diagnosis not present

## 2017-05-12 DIAGNOSIS — Z951 Presence of aortocoronary bypass graft: Secondary | ICD-10-CM | POA: Diagnosis not present

## 2017-05-12 DIAGNOSIS — E785 Hyperlipidemia, unspecified: Secondary | ICD-10-CM | POA: Diagnosis not present

## 2017-05-13 DIAGNOSIS — L309 Dermatitis, unspecified: Secondary | ICD-10-CM | POA: Diagnosis not present

## 2017-05-13 DIAGNOSIS — Z23 Encounter for immunization: Secondary | ICD-10-CM | POA: Diagnosis not present

## 2017-05-13 DIAGNOSIS — E785 Hyperlipidemia, unspecified: Secondary | ICD-10-CM | POA: Diagnosis not present

## 2017-05-31 DIAGNOSIS — Z951 Presence of aortocoronary bypass graft: Secondary | ICD-10-CM | POA: Diagnosis not present

## 2017-06-07 DIAGNOSIS — K649 Unspecified hemorrhoids: Secondary | ICD-10-CM | POA: Diagnosis not present

## 2017-06-15 DIAGNOSIS — H5211 Myopia, right eye: Secondary | ICD-10-CM | POA: Diagnosis not present

## 2017-06-15 DIAGNOSIS — H5202 Hypermetropia, left eye: Secondary | ICD-10-CM | POA: Diagnosis not present

## 2017-06-15 DIAGNOSIS — H52223 Regular astigmatism, bilateral: Secondary | ICD-10-CM | POA: Diagnosis not present

## 2017-09-29 DIAGNOSIS — Z125 Encounter for screening for malignant neoplasm of prostate: Secondary | ICD-10-CM | POA: Diagnosis not present

## 2017-09-29 DIAGNOSIS — Z0000 Encounter for general adult medical examination without abnormal findings: Secondary | ICD-10-CM | POA: Diagnosis not present
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
## 2018-05-10 DIAGNOSIS — Z951 Presence of aortocoronary bypass graft: Secondary | ICD-10-CM | POA: Diagnosis not present

## 2018-05-10 DIAGNOSIS — E785 Hyperlipidemia, unspecified: Secondary | ICD-10-CM | POA: Diagnosis not present

## 2018-05-10 DIAGNOSIS — I10 Essential (primary) hypertension: Secondary | ICD-10-CM | POA: Diagnosis not present

## 2018-08-20 DIAGNOSIS — M545 Low back pain: Secondary | ICD-10-CM | POA: Diagnosis not present

## 2018-10-09 DIAGNOSIS — Z13220 Encounter for screening for lipoid disorders: Secondary | ICD-10-CM | POA: Diagnosis not present

## 2018-10-09 DIAGNOSIS — Z0000 Encounter for general adult medical examination without abnormal findings: Secondary | ICD-10-CM | POA: Diagnosis not present

## 2018-10-09 DIAGNOSIS — Z23 Encounter for immunization: Secondary | ICD-10-CM | POA: Diagnosis not present

## 2019-10-05 ENCOUNTER — Ambulatory Visit: Payer: Self-pay | Attending: Internal Medicine

## 2019-10-05 DIAGNOSIS — Z23 Encounter for immunization: Secondary | ICD-10-CM

## 2019-10-05 NOTE — Progress Notes (Signed)
   Covid-19 Vaccination Clinic  Name:  Mark Moyer    MRN: [id number] DOB: 1961/02/12  10/05/2019  [name] was observed post Covid-19 immunization for 15 minutes without incident. He was provided with Vaccine Information Sheet and instruction to access the V-Safe system.   [name] was instructed to call 911 with any severe reactions post vaccine: Marland Kitchen Difficulty breathing  . Swelling of face and throat  . A fast heartbeat  . A bad rash all over body  . Dizziness and weakness   Immunizations Administered    Name Date Dose VIS Date Route   Moderna COVID-19 Vaccine 10/05/2019 11:28 AM 0.5 mL 06/19/2019 Intramuscular   Manufacturer: Moderna   Lot: [id number]   NDC: 31438-887-57

## 2019-11-06 ENCOUNTER — Ambulatory Visit: Payer: Self-pay | Attending: Internal Medicine

## 2019-11-06 DIAGNOSIS — Z23 Encounter for immunization: Secondary | ICD-10-CM

## 2019-11-06 NOTE — Progress Notes (Signed)
   Covid-19 Vaccination Clinic  Name:  Mark Moyer    MRN: [id number] DOB: Jul 17, 1961  11/06/2019  [name] was observed post Covid-19 immunization for 15 minutes without incident. He was provided with Vaccine Information Sheet and instruction to access the V-Safe system.   [name] was instructed to call 911 with any severe reactions post vaccine: Marland Kitchen Difficulty breathing  . Swelling of face and throat  . A fast heartbeat  . A bad rash all over body  . Dizziness and weakness   Immunizations Administered    Name Date Dose VIS Date Route   Moderna COVID-19 Vaccine 11/06/2019 10:14 AM 0.5 mL 06/2019 Intramuscular   Manufacturer: Moderna   Lot: [id number]   NDC: 71855-015-86
